# Patient Record
Sex: Female | Born: 1970 | Race: White | Hispanic: No | Marital: Married | Smoking: Never smoker
Health system: Southern US, Community
[De-identification: ages and names within clinical notes are randomized; demographics above are authoritative.]

## PROBLEM LIST (undated history)

## (undated) DIAGNOSIS — R51 Headache: Secondary | ICD-10-CM

## (undated) DIAGNOSIS — Z9289 Personal history of other medical treatment: Secondary | ICD-10-CM

## (undated) DIAGNOSIS — K8020 Calculus of gallbladder without cholecystitis without obstruction: Secondary | ICD-10-CM

## (undated) DIAGNOSIS — D649 Anemia, unspecified: Secondary | ICD-10-CM

## (undated) DIAGNOSIS — F329 Major depressive disorder, single episode, unspecified: Secondary | ICD-10-CM

## (undated) DIAGNOSIS — R569 Unspecified convulsions: Secondary | ICD-10-CM

## (undated) DIAGNOSIS — F32A Depression, unspecified: Secondary | ICD-10-CM

## (undated) DIAGNOSIS — M419 Scoliosis, unspecified: Secondary | ICD-10-CM

## (undated) DIAGNOSIS — I10 Essential (primary) hypertension: Secondary | ICD-10-CM

## (undated) HISTORY — PX: BREAST SURGERY: SHX581

## (undated) HISTORY — PX: WISDOM TOOTH EXTRACTION: SHX21

## (undated) HISTORY — PX: SPINE SURGERY: SHX786

---

## 1999-03-09 ENCOUNTER — Encounter: Payer: Self-pay | Admitting: Obstetrics and Gynecology

## 1999-03-09 ENCOUNTER — Ambulatory Visit (HOSPITAL_COMMUNITY): Admission: RE | Admit: 1999-03-09 | Discharge: 1999-03-09 | Payer: Self-pay | Admitting: Obstetrics and Gynecology

## 1999-07-01 ENCOUNTER — Inpatient Hospital Stay (HOSPITAL_COMMUNITY): Admission: AD | Admit: 1999-07-01 | Discharge: 1999-07-03 | Payer: Self-pay | Admitting: Obstetrics and Gynecology

## 2004-12-23 ENCOUNTER — Ambulatory Visit: Payer: Self-pay | Admitting: Family Medicine

## 2005-01-05 ENCOUNTER — Ambulatory Visit (HOSPITAL_BASED_OUTPATIENT_CLINIC_OR_DEPARTMENT_OTHER): Admission: RE | Admit: 2005-01-05 | Discharge: 2005-01-05 | Payer: Self-pay | Admitting: Urology

## 2005-01-05 ENCOUNTER — Ambulatory Visit (HOSPITAL_COMMUNITY): Admission: RE | Admit: 2005-01-05 | Discharge: 2005-01-05 | Payer: Self-pay | Admitting: Urology

## 2005-08-16 ENCOUNTER — Ambulatory Visit: Payer: Self-pay | Admitting: Family Medicine

## 2006-04-18 ENCOUNTER — Encounter: Admission: RE | Admit: 2006-04-18 | Discharge: 2006-04-18 | Payer: Self-pay | Admitting: General Surgery

## 2006-06-18 ENCOUNTER — Ambulatory Visit: Payer: Self-pay | Admitting: Family Medicine

## 2006-06-29 ENCOUNTER — Ambulatory Visit (HOSPITAL_COMMUNITY): Admission: RE | Admit: 2006-06-29 | Discharge: 2006-06-29 | Payer: Self-pay | Admitting: Family Medicine

## 2006-07-24 ENCOUNTER — Encounter: Admission: RE | Admit: 2006-07-24 | Discharge: 2006-07-24 | Payer: Self-pay | Admitting: Neurosurgery

## 2006-12-12 IMAGING — CT CT L SPINE W/ CM
1 of 11 series · 5 of 20 positions shown, 7 images · IV contrast (omnipaque)
Comparison: none

CLINICAL DATA: Right buttock pain.  Right lumbar radiculopathy.  
LUMBAR MYELOGRAM:
TECHNIQUE: Following informed consent, sterile preparation of the back, and adequate local anesthesia, a lumbar puncture was performed using a 22 gauge spinal needle at L4-5 from a left paramedian approach.  Fluid was clear and colorless.  15 cc of Omnipaque 180 was instilled in the subarachnoid space.
TECHNIQUE: Multidetector CT imaging of the lumbar spine was performed after intrathecal injection of contrast.  Multiplanar CT image reconstructions were also generated.

[Series 4: recon 3: l spine · axial · 0.27mm/px · z∈[-235,-109]mm · 5 of 316 slices shown, 7 images]
[im 53/316  soft-tissue]
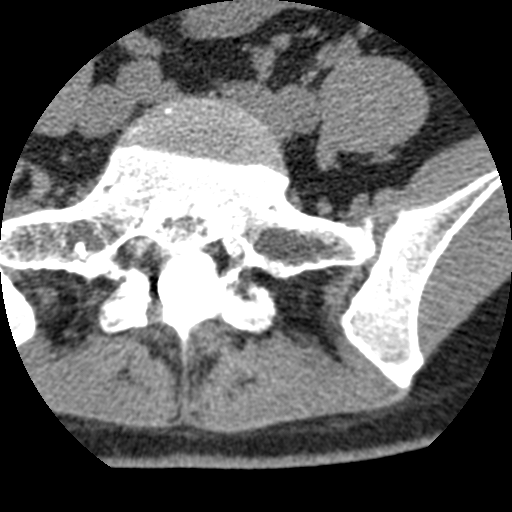
[im 53/316  bone]
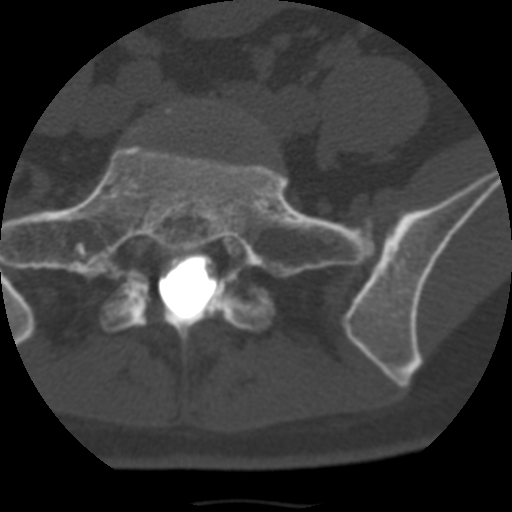
[im 106/316  bone]
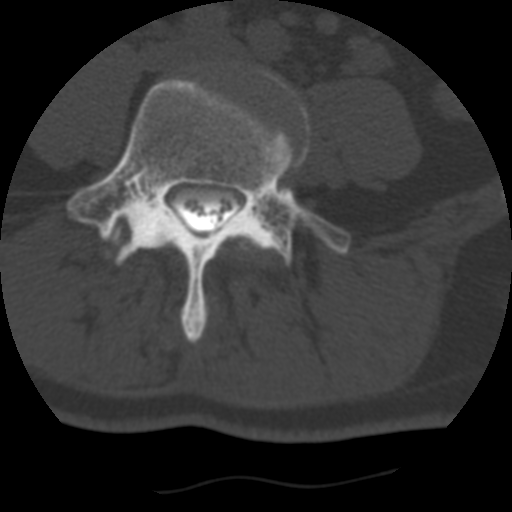
[im 158/316  bone]
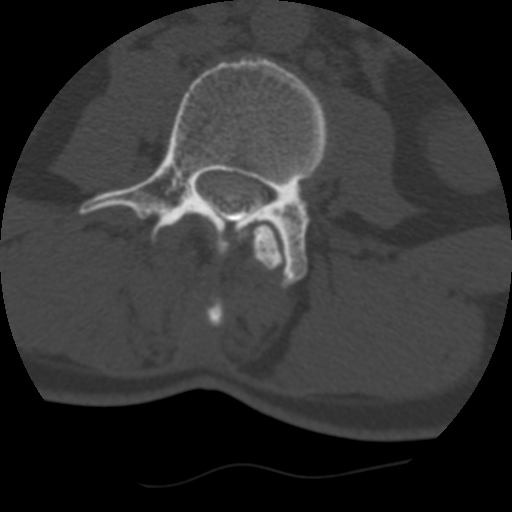
[im 211/316  bone]
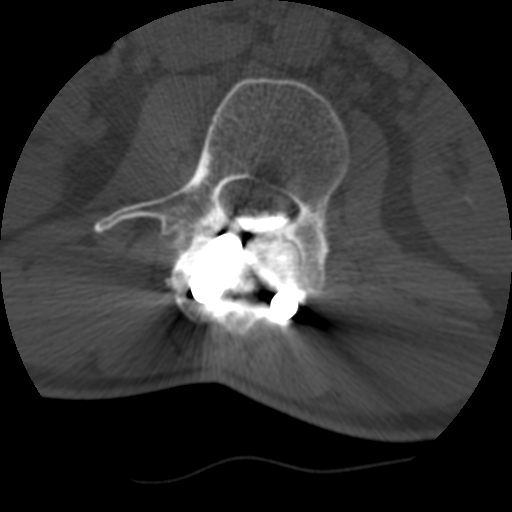
[im 263/316  soft-tissue]
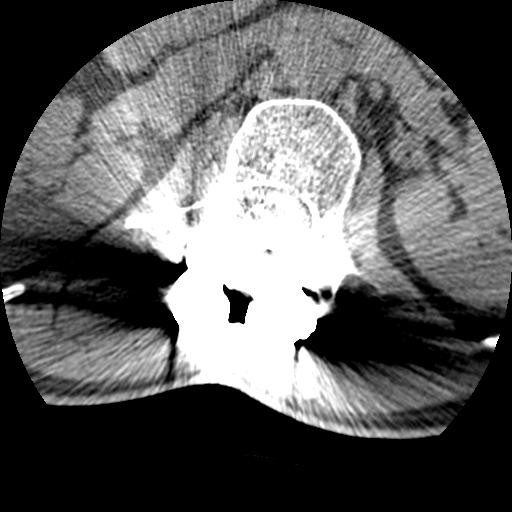
[im 263/316  bone]
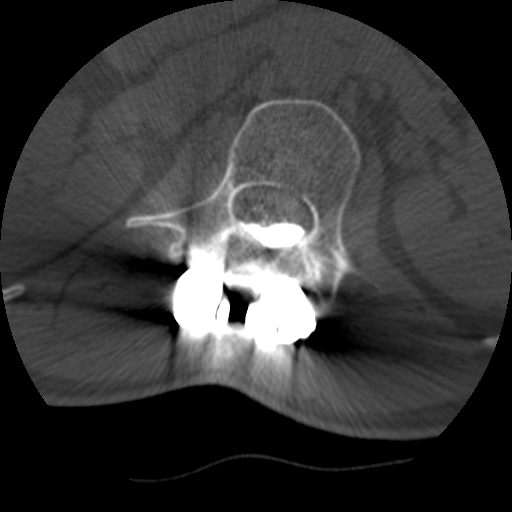

[5 of 20 positions shown; findings below may reference images not displayed]

FINDINGS: A mixed injection was obtained.  Overall the study, however, is diagnostic.  There has been previous thoracolumbar fusion with Harrington rods for scoliosis.  This occurred when the patient was a teenager.    There is no obvious disk protrusion, spinal stenosis or nerve root encroachment in the lumbar region.  Harrington rods are intact and the hooks are appropriately placed.  I see no right sided nerve root encroachment.  The patient does have bilateral renal calculi more numerous on the right.  
POST MYELOGRAM CT SCAN OF THE LUMBAR SPINE:
FINDINGS: L1-2:  Degraded by artifact.  No gross abnormality.  
L2-3:  Degraded by artifact.  No gross abnormality.
L3-4:  Normal interspace. 
L4-5:  Mild facet disease.  Otherwise normal interspace. 
L5-S1:  Normal interspace.
IMPRESSION: 1.  Harrington rod fusion for scoliosis as a teenager without adverse features. 
2.  No right sided neural encroachment identified in the lower lumbar spaces specifically L2-3, L3-4, L4-5 and L5-S1.

## 2013-01-08 ENCOUNTER — Other Ambulatory Visit: Payer: Self-pay | Admitting: Family Medicine

## 2013-01-08 DIAGNOSIS — N63 Unspecified lump in unspecified breast: Secondary | ICD-10-CM

## 2013-01-08 DIAGNOSIS — R1011 Right upper quadrant pain: Secondary | ICD-10-CM

## 2013-01-09 ENCOUNTER — Other Ambulatory Visit: Payer: Self-pay | Admitting: Family Medicine

## 2013-01-09 ENCOUNTER — Other Ambulatory Visit: Payer: Self-pay

## 2013-01-09 ENCOUNTER — Ambulatory Visit
Admission: RE | Admit: 2013-01-09 | Discharge: 2013-01-09 | Disposition: A | Discharge status: Home / Self Care | Payer: BC Managed Care – PPO | Source: Ambulatory Visit | Attending: Family Medicine | Admitting: Family Medicine

## 2013-01-09 DIAGNOSIS — R1011 Right upper quadrant pain: Secondary | ICD-10-CM

## 2013-01-20 ENCOUNTER — Ambulatory Visit (INDEPENDENT_AMBULATORY_CARE_PROVIDER_SITE_OTHER): Payer: BC Managed Care – PPO | Admitting: Surgery

## 2013-01-20 ENCOUNTER — Encounter (INDEPENDENT_AMBULATORY_CARE_PROVIDER_SITE_OTHER): Payer: Self-pay | Admitting: Surgery

## 2013-01-20 VITALS — BP 120/68 | HR 80 | Temp 98.4°F | Resp 16 | Ht 64.0 in | Wt 146.4 lb

## 2013-01-20 DIAGNOSIS — K8050 Calculus of bile duct without cholangitis or cholecystitis without obstruction: Secondary | ICD-10-CM

## 2013-01-20 DIAGNOSIS — K8020 Calculus of gallbladder without cholecystitis without obstruction: Secondary | ICD-10-CM

## 2013-01-20 NOTE — Progress Notes (Signed)
Patient ID: Mary Pope, female   DOB: 01/30/1971, 42 y.o.   MRN: [id number]  Chief Complaint  Patient presents with  . New Evaluation    eval GB with stones    HPI Mary Pope is a 42 y.o. female.  Patient sent at the request of Dr. Rosezetta Schlatter for epigastric abdominal pain. Pain is after eating. Pain is located in the epigastrium and right upper quadrant. Moderate intensity. Last minutes to hours after eating. One year history of these symptoms which have been intermittent. HPI  History reviewed. No pertinent past medical history.  Past Surgical History  Procedure Date  . Breast surgery     multiple Bx's  . Spine surgery     Family History  Problem Relation Age of Onset  . Cancer Mother     breast  . Cancer Maternal Grandfather     lung    Social History History  Substance Use Topics  . Smoking status: Former Games developer  . Smokeless tobacco: Never Used  . Alcohol Use: No    Not on File  Current Outpatient Prescriptions  Medication Sig Dispense Refill  . lisinopril (PRINIVIL,ZESTRIL) 5 MG tablet Take 5 mg by mouth daily. Pt unsure of actual dosage      . SUMAtriptan (IMITREX) 50 MG tablet Take 50 mg by mouth every 2 (two) hours as needed. Pt unsure of actual dosage        Review of Systems Review of Systems  Constitutional: Negative for fever, chills and unexpected weight change.  HENT: Negative for hearing loss, congestion, sore throat, trouble swallowing and voice change.   Eyes: Negative for visual disturbance.  Respiratory: Negative for cough and wheezing.   Cardiovascular: Negative for chest pain, palpitations and leg swelling.  Gastrointestinal: Positive for abdominal pain. Negative for nausea, vomiting, diarrhea, constipation, blood in stool, abdominal distention and anal bleeding.  Genitourinary: Negative for hematuria, vaginal bleeding and difficulty urinating.  Musculoskeletal: Negative for arthralgias.  Skin: Negative for rash and wound.   Neurological: Negative for seizures, syncope and headaches.  Hematological: Negative for adenopathy. Does not bruise/bleed easily.  Psychiatric/Behavioral: Negative for confusion.    Blood pressure 120/68, pulse 80, temperature 98.4 F (36.9 C), temperature source Temporal, resp. rate 16, height 5\' 4"  (1.626 m), weight 146 lb 6.4 oz (66.407 kg), last menstrual period 01/09/2013.  Physical Exam Physical Exam  Constitutional: She is oriented to person, place, and time. She appears well-developed and well-nourished.  HENT:  Head: Normocephalic and atraumatic.  Eyes: EOM are normal. Pupils are equal, round, and reactive to light.  Neck: Normal range of motion. Neck supple.  Cardiovascular: Normal rate and regular rhythm.   Pulmonary/Chest: Effort normal and breath sounds normal.  Abdominal: Soft. Bowel sounds are normal. She exhibits no distension. There is no tenderness. There is no rebound and no guarding.  Musculoskeletal: Normal range of motion.  Neurological: She is alert and oriented to person, place, and time.  Skin: Skin is warm and dry.  Psychiatric: She has a normal mood and affect. Her behavior is normal. Judgment and thought content normal.    Data Reviewed  US Abdomen Limited RUQ   Status: Final result       PACS Images     Show images for US Abdomen Limited RUQ      Study Result     *RADIOLOGY REPORT*   Clinical Data:  Right upper quadrant abdominal pain   LIMITED ABDOMINAL ULTRASOUND - RIGHT UPPER QUADRANT   Comparison:  [facility] of 01/05/2005   Findings:   Gallbladder:  There are multiple small gallstones present of no more than 8 mm in diameter with shadowing.  There is no pain over the gallbladder with compression.   Common bile duct:  The common bile duct is normal measuring 2.9 mm in diameter.   Liver:  The liver has a normal echogenic pattern.  No ductal dilatation is seen.  A cyst is noted in the medial left lobe of 1.5 x 1.2 x 1.9 cm.    IMPRESSION:   1.  Multiple small gallstones of 8 mm or less in size.  No evidence of acute cholecystitis by ultrasound. 2.  No ductal dilatation.                                    Assessment    Biliary colic    Plan    Recommend laparoscopic cholecystectomy with cholangiogram.The procedure has been discussed with the patient. Operative and non operative treatments have been discussed. Risks of surgery include bleeding, infection,  Common bile duct injury,  Injury to the stomach,liver, colon,small intestine, abdominal wall,  Diaphragm,  Major blood vessels,  And the need for an open procedure.  Other risks include worsening of medical problems, death,  DVT and pulmonary embolism, and cardiovascular events.   Medical options have also been discussed. The patient has been informed of long term expectations of surgery and non surgical options,  The patient agrees to proceed.         Kanitra Purifoy A. 01/20/2013, 10:08 AM

## 2013-01-20 NOTE — Patient Instructions (Signed)
Laparoscopic Cholecystectomy Care After Refer to this sheet in the next few weeks. These instructions provide you with information on caring for yourself after your procedure. Your caregiver may also give you more specific instructions. Your treatment has been planned according to current medical practices, but problems sometimes occur. Call your caregiver if you have any problems or questions after your procedure. HOME CARE INSTRUCTIONS   Change bandages (dressings) as directed by your caregiver.  Keep the wound dry and clean. The wound may be washed gently with soap and water. Gently blot or dab the area dry.  Do not take baths or use swimming pools or hot tubs for 10 days, or as instructed by your caregiver.  Only take over-the-counter or prescription medicines for pain, discomfort, or fever as directed by your caregiver.  Continue your normal diet as directed by your caregiver.  Do not lift anything heavier than 25 pounds (11.5 kg), or as directed by your caregiver.  Do not play contact sports for 1 week, or as directed by your caregiver. SEEK MEDICAL CARE IF:   There is redness, swelling, or increasing pain in the wound.  You notice yellowish-white fluid (pus) coming from the wound.  There is drainage from the wound that lasts longer than 1 day.  There is a bad smell coming from the wound or dressing.  The surgical cut (incision) breaks open. SEEK IMMEDIATE MEDICAL CARE IF:   You develop a rash.  You have difficulty breathing.  You develop chest pain.  You develop any reaction or side effects to medicines given.  You have a fever.  You have increasing pain in the shoulders (shoulder strap areas).  You have dizzy episodes or faint while standing.  You develop severe abdominal pain.  You feel sick to your stomach (nauseous) or throw up (vomit) and this lasts for more than 1 day. MAKE SURE YOU:   Understand these instructions.  Will watch your condition.  Will  get help right away if you are not doing well or get worse. Document Released: 12/04/2005 Document Revised: 02/26/2012 Document Reviewed: 05/19/2011 [facility] Patient Information 2013 [place], [place]. Laparoscopic Cholecystectomy Laparoscopic cholecystectomy is surgery to remove the gallbladder. The gallbladder is located slightly to the right of center in the abdomen, behind the liver. It is a concentrating and storage sac for the bile produced in the liver. Bile aids in the digestion and absorption of fats. Gallbladder disease (cholecystitis) is an inflammation of your gallbladder. This condition is usually caused by a buildup of gallstones (cholelithiasis) in your gallbladder. Gallstones can block the flow of bile, resulting in inflammation and pain. In severe cases, emergency surgery may be required. When emergency surgery is not required, you will have time to prepare for the procedure. Laparoscopic surgery is an alternative to open surgery. Laparoscopic surgery usually has a shorter recovery time. Your common bile duct may also need to be examined and explored. Your caregiver will discuss this with you if he or she feels this should be done. If stones are found in the common bile duct, they may be removed. LET YOUR CAREGIVER KNOW ABOUT:  Allergies to food or medicine.  Medicines taken, including vitamins, herbs, eyedrops, over-the-counter medicines, and creams.  Use of steroids (by mouth or creams).  Previous problems with anesthetics or numbing medicines.  History of bleeding problems or blood clots.  Previous surgery.  Other health problems, including diabetes and kidney problems.  Possibility of pregnancy, if this applies. RISKS AND COMPLICATIONS All surgery is associated  with risks. Some problems that may occur following this procedure include:  Infection.  Damage to the common bile duct, nerves, arteries, veins, or other internal organs such as the stomach or  intestines.  Bleeding.  A stone may remain in the common bile duct. BEFORE THE PROCEDURE  Do not take aspirin for 3 days prior to surgery or blood thinners for 1 week prior to surgery.  Do not eat or drink anything after midnight the night before surgery.  Let your caregiver know if you develop a cold or other infectious problem prior to surgery.  You should be present 60 minutes before the procedure or as directed. PROCEDURE  You will be given medicine that makes you sleep (general anesthetic). When you are asleep, your surgeon will make several small cuts (incisions) in your abdomen. One of these incisions is used to insert a small, lighted scope (laparoscope) into the abdomen. The laparoscope helps the surgeon see into your abdomen. Carbon dioxide gas will be pumped into your abdomen. The gas allows more room for the surgeon to perform your surgery. Other operating instruments are inserted through the other incisions. Laparoscopic procedures may not be appropriate when:  There is major scarring from previous surgery.  The gallbladder is extremely inflamed.  There are bleeding disorders or unexpected cirrhosis of the liver.  A pregnancy is near term.  Other conditions make the laparoscopic procedure impossible. If your surgeon feels it is not safe to continue with a laparoscopic procedure, he or she will perform an open abdominal procedure. In this case, the surgeon will make an incision to open the abdomen. This gives the surgeon a larger view and field to work within. This may allow the surgeon to perform procedures that sometimes cannot be performed with a laparoscope alone. Open surgery has a longer recovery time. AFTER THE PROCEDURE  You will be taken to the recovery area where a nurse will watch and check your progress.  You may be allowed to go home the same day.  Do not resume physical activities until directed by your caregiver.  You may resume a normal diet and  activities as directed. Document Released: 12/04/2005 Document Revised: 02/26/2012 Document Reviewed: 05/19/2011 [facility] Patient Information 2013 [place], [place].

## 2013-01-24 ENCOUNTER — Encounter (HOSPITAL_COMMUNITY): Payer: Self-pay | Admitting: Pharmacy Technician

## 2013-01-28 ENCOUNTER — Ambulatory Visit (HOSPITAL_COMMUNITY)
Admission: RE | Admit: 2013-01-28 | Discharge: 2013-01-28 | Disposition: A | Discharge status: Home / Self Care | Payer: BC Managed Care – PPO | Source: Ambulatory Visit | Attending: Surgery | Admitting: Surgery

## 2013-01-28 ENCOUNTER — Encounter (HOSPITAL_COMMUNITY)
Admission: RE | Admit: 2013-01-28 | Discharge: 2013-01-28 | Disposition: A | Discharge status: Home / Self Care | Payer: BC Managed Care – PPO | Source: Ambulatory Visit | Attending: Surgery | Admitting: Surgery

## 2013-01-28 ENCOUNTER — Encounter (HOSPITAL_COMMUNITY): Payer: Self-pay

## 2013-01-28 DIAGNOSIS — Z01812 Encounter for preprocedural laboratory examination: Secondary | ICD-10-CM | POA: Insufficient documentation

## 2013-01-28 DIAGNOSIS — M4180 Other forms of scoliosis, site unspecified: Secondary | ICD-10-CM | POA: Insufficient documentation

## 2013-01-28 DIAGNOSIS — K8020 Calculus of gallbladder without cholecystitis without obstruction: Secondary | ICD-10-CM | POA: Insufficient documentation

## 2013-01-28 DIAGNOSIS — Z01810 Encounter for preprocedural cardiovascular examination: Secondary | ICD-10-CM | POA: Insufficient documentation

## 2013-01-28 HISTORY — DX: Scoliosis, unspecified: M41.9

## 2013-01-28 HISTORY — DX: Calculus of gallbladder without cholecystitis without obstruction: K80.20

## 2013-01-28 HISTORY — DX: Essential (primary) hypertension: I10

## 2013-01-28 HISTORY — DX: Headache: R51

## 2013-01-28 HISTORY — DX: Unspecified convulsions: R56.9

## 2013-01-28 LAB — CBC WITH DIFFERENTIAL/PLATELET
Basophils Relative: 0 % (ref 0–1)
Eosinophils Absolute: 0.3 10*3/uL (ref 0.0–0.7)
Eosinophils Relative: 4 % (ref 0–5)
MCH: 30.9 pg (ref 26.0–34.0)
MCHC: 35.6 g/dL (ref 30.0–36.0)
Neutrophils Relative %: 55 % (ref 43–77)
Platelets: 316 10*3/uL (ref 150–400)
RDW: 12.3 % (ref 11.5–15.5)

## 2013-01-28 LAB — SURGICAL PCR SCREEN
MRSA, PCR: NEGATIVE
Staphylococcus aureus: POSITIVE — AB

## 2013-01-28 LAB — COMPREHENSIVE METABOLIC PANEL
ALT: 12 U/L (ref 0–35)
Albumin: 4 g/dL (ref 3.5–5.2)
Alkaline Phosphatase: 47 U/L (ref 39–117)
Calcium: 9.7 mg/dL (ref 8.4–10.5)
Potassium: 3.9 mEq/L (ref 3.5–5.1)
Sodium: 133 mEq/L — ABNORMAL LOW (ref 135–145)
Total Protein: 7.7 g/dL (ref 6.0–8.3)

## 2013-01-28 NOTE — Patient Instructions (Signed)
YOUR SURGERY IS SCHEDULED AT [facility]  ON:  Friday  2/14  REPORT TO [place] [facility] AT:  5:30 AM      PHONE # FOR SHORT STAY IS [phone]  DO NOT EAT OR DRINK ANYTHING AFTER MIDNIGHT THE NIGHT BEFORE YOUR SURGERY.  YOU MAY BRUSH YOUR TEETH, RINSE OUT YOUR MOUTH--BUT NO WATER, NO FOOD, NO CHEWING GUM, NO MINTS, NO CANDIES, NO CHEWING TOBACCO.  PLEASE TAKE THE FOLLOWING MEDICATIONS THE AM OF YOUR SURGERY WITH A FEW SIPS OF WATER: TAKE IMITREX IF MIGRAINE.    DO NOT BRING VALUABLES, MONEY, CREDIT CARDS.  DO NOT WEAR JEWELRY, MAKE-UP, NAIL POLISH AND NO METAL PINS OR CLIPS IN YOUR HAIR. CONTACT LENS, DENTURES / PARTIALS, GLASSES SHOULD NOT BE WORN TO SURGERY AND IN MOST CASES-HEARING AIDS WILL NEED TO BE REMOVED.  BRING YOUR GLASSES CASE, ANY EQUIPMENT NEEDED FOR YOUR CONTACT LENS. FOR PATIENTS ADMITTED TO THE HOSPITAL--CHECK OUT TIME THE DAY OF DISCHARGE IS 11:00 AM.  ALL INPATIENT ROOMS ARE PRIVATE - WITH BATHROOM, TELEPHONE, TELEVISION AND WIFI INTERNET.  IF YOU ARE BEING DISCHARGED THE SAME DAY OF YOUR SURGERY--YOU CAN NOT DRIVE YOURSELF HOME--AND SHOULD NOT GO HOME ALONE BY TAXI OR BUS.  NO DRIVING OR OPERATING MACHINERY FOR 24 HOURS FOLLOWING ANESTHESIA / PAIN MEDICATIONS.  PLEASE MAKE ARRANGEMENTS FOR SOMEONE TO BE WITH YOU AT HOME THE FIRST 24 HOURS AFTER SURGERY. RESPONSIBLE DRIVER'S NAME TONY Geibel                                               PHONE #   [phone]                            PLEASE READ OVER ANY  FACT SHEETS THAT YOU WERE GIVEN: MRSA INFORMATION, BLOOD TRANSFUSION INFORMATION, INCENTIVE SPIROMETER INFORMATION. FAILURE TO FOLLOW THESE INSTRUCTIONS MAY RESULT IN THE CANCELLATION OF YOUR SURGERY.   PATIENT SIGNATURE_________________________________

## 2013-01-28 NOTE — Pre-Procedure Instructions (Signed)
PREOP CBC,DIFF, CMET, SERUM PREG, EKG, CXR WERE DONE TODAY AT [facility] AS PER ORDERS DR. CORNETT AND ANESTHESIOLOGIST'S GUIDELINES.

## 2013-01-29 NOTE — Pre-Procedure Instructions (Signed)
PT'S PRESCRIPTION FOR MUPIROCIN CALLED IN TO [facility] AS PER REQUEST OF PT-SHE STATES HER HUSBAND WILL PICK UP RX AND SHE WILL START TX TODAY - POSITIVE [facility] AUREUS PCR.

## 2013-01-30 NOTE — Anesthesia Preprocedure Evaluation (Addendum)
Anesthesia Evaluation  Patient identified by MRN, date of birth, ID band Patient awake    Reviewed: Allergy & Precautions, H&P , NPO status , Patient's Chart, lab work & pertinent test results  Airway Mallampati: II TM Distance: >3 FB Neck ROM: full    Dental no notable dental hx. (+) Teeth Intact and Dental Advisory Given   Pulmonary neg pulmonary ROS,  breath sounds clear to auscultation  Pulmonary exam normal       Cardiovascular Exercise Tolerance: Good hypertension, Pt. on medications Rhythm:regular Rate:Normal     Neuro/Psych Seizures -, Well Controlled,  Remote seizures negative neurological ROS  negative psych ROS   GI/Hepatic negative GI ROS, Neg liver ROS,   Endo/Other  negative endocrine ROS  Renal/GU negative Renal ROS  negative genitourinary   Musculoskeletal   Abdominal   Peds  Hematology negative hematology ROS (+)   Anesthesia Other Findings   Reproductive/Obstetrics negative OB ROS                         Anesthesia Physical Anesthesia Plan  ASA: II  Anesthesia Plan: General   Post-op Pain Management:    Induction: Intravenous  Airway Management Planned: Oral ETT  Additional Equipment:   Intra-op Plan:   Post-operative Plan: Extubation in OR  Informed Consent: I have reviewed the patients History and Physical, chart, labs and discussed the procedure including the risks, benefits and alternatives for the proposed anesthesia with the patient or authorized representative who has indicated his/her understanding and acceptance.   Dental Advisory Given  Plan Discussed with: CRNA and Surgeon  Anesthesia Plan Comments:         Anesthesia Quick Evaluation

## 2013-01-31 ENCOUNTER — Encounter (HOSPITAL_COMMUNITY): Payer: Self-pay | Admitting: Anesthesiology

## 2013-01-31 ENCOUNTER — Ambulatory Visit (HOSPITAL_COMMUNITY)
Admission: RE | Admit: 2013-01-31 | Discharge: 2013-01-31 | Disposition: A | Discharge status: Home / Self Care | Payer: BC Managed Care – PPO | Source: Ambulatory Visit | Attending: Surgery | Admitting: Surgery

## 2013-01-31 ENCOUNTER — Ambulatory Visit (HOSPITAL_COMMUNITY): Payer: BC Managed Care – PPO | Admitting: Anesthesiology

## 2013-01-31 ENCOUNTER — Ambulatory Visit (HOSPITAL_COMMUNITY): Payer: BC Managed Care – PPO

## 2013-01-31 ENCOUNTER — Encounter (HOSPITAL_COMMUNITY)
Admission: RE | Disposition: A | Discharge status: Home / Self Care | Payer: Self-pay | Source: Ambulatory Visit | Attending: Surgery

## 2013-01-31 ENCOUNTER — Encounter (HOSPITAL_COMMUNITY): Payer: Self-pay | Admitting: *Deleted

## 2013-01-31 DIAGNOSIS — Z9889 Other specified postprocedural states: Secondary | ICD-10-CM

## 2013-01-31 DIAGNOSIS — K8010 Calculus of gallbladder with chronic cholecystitis without obstruction: Secondary | ICD-10-CM

## 2013-01-31 DIAGNOSIS — Z79899 Other long term (current) drug therapy: Secondary | ICD-10-CM | POA: Insufficient documentation

## 2013-01-31 DIAGNOSIS — I10 Essential (primary) hypertension: Secondary | ICD-10-CM | POA: Insufficient documentation

## 2013-01-31 HISTORY — PX: CHOLECYSTECTOMY: SHX55

## 2013-01-31 SURGERY — LAPAROSCOPIC CHOLECYSTECTOMY WITH INTRAOPERATIVE CHOLANGIOGRAM
Anesthesia: General | Site: Abdomen | Wound class: Clean Contaminated

## 2013-01-31 MED ORDER — PROMETHAZINE HCL 12.5 MG PO TABS
12.5000 mg | ORAL_TABLET | Freq: Four times a day (QID) | ORAL | Status: DC | PRN
Start: 2013-01-31 — End: 2013-02-18

## 2013-01-31 MED ORDER — CEFAZOLIN SODIUM-DEXTROSE 2-3 GM-% IV SOLR
2.0000 g | INTRAVENOUS | Status: AC
  Administered 2013-01-31: 2 g via INTRAVENOUS

## 2013-01-31 MED ORDER — ACETAMINOPHEN 325 MG PO TABS: 650.0000 mg | ORAL_TABLET | ORAL | Status: DC | PRN

## 2013-01-31 MED ORDER — ACETAMINOPHEN 10 MG/ML IV SOLN
INTRAVENOUS | Status: AC
  Filled 2013-01-31: qty 100

## 2013-01-31 MED ORDER — CHLORHEXIDINE GLUCONATE 4 % EX LIQD
1.0000 "application " | Freq: Once | CUTANEOUS | Status: DC
  Filled 2013-01-31: qty 15

## 2013-01-31 MED ORDER — LACTATED RINGERS IV SOLN: INTRAVENOUS | Status: DC

## 2013-01-31 MED ORDER — SODIUM CHLORIDE 0.9 % IJ SOLN: 3.0000 mL | INTRAMUSCULAR | Status: DC | PRN

## 2013-01-31 MED ORDER — ROCURONIUM BROMIDE 100 MG/10ML IV SOLN
INTRAVENOUS | Status: DC | PRN
  Administered 2013-01-31: 30 mg via INTRAVENOUS

## 2013-01-31 MED ORDER — SODIUM CHLORIDE 0.9 % IJ SOLN: 3.0000 mL | Freq: Two times a day (BID) | INTRAMUSCULAR | Status: DC

## 2013-01-31 MED ORDER — GLYCOPYRROLATE 0.2 MG/ML IJ SOLN
INTRAMUSCULAR | Status: DC | PRN
  Administered 2013-01-31: .5 mg via INTRAVENOUS

## 2013-01-31 MED ORDER — NEOSTIGMINE METHYLSULFATE 1 MG/ML IJ SOLN
INTRAMUSCULAR | Status: DC | PRN
  Administered 2013-01-31: 3 mg via INTRAVENOUS

## 2013-01-31 MED ORDER — FENTANYL CITRATE 0.05 MG/ML IJ SOLN
INTRAMUSCULAR | Status: AC
  Filled 2013-01-31: qty 2

## 2013-01-31 MED ORDER — IOHEXOL 300 MG/ML  SOLN
INTRAMUSCULAR | Status: DC | PRN
  Administered 2013-01-31: 4 mL

## 2013-01-31 MED ORDER — PROPOFOL 10 MG/ML IV BOLUS
INTRAVENOUS | Status: DC | PRN
  Administered 2013-01-31: 150 mg via INTRAVENOUS

## 2013-01-31 MED ORDER — OXYCODONE HCL 5 MG PO TABS
5.0000 mg | ORAL_TABLET | ORAL | Status: DC | PRN
  Administered 2013-01-31 (×2): 5 mg via ORAL
  Filled 2013-01-31 (×2): qty 1

## 2013-01-31 MED ORDER — MORPHINE SULFATE 10 MG/ML IJ SOLN: 1.0000 mg | INTRAMUSCULAR | Status: DC | PRN

## 2013-01-31 MED ORDER — SODIUM CHLORIDE 0.9 % IV SOLN: 250.0000 mL | INTRAVENOUS | Status: DC | PRN

## 2013-01-31 MED ORDER — FENTANYL CITRATE 0.05 MG/ML IJ SOLN
INTRAMUSCULAR | Status: AC
  Administered 2013-01-31: 25 ug via INTRAVENOUS
  Filled 2013-01-31: qty 2

## 2013-01-31 MED ORDER — LIDOCAINE HCL (PF) 2 % IJ SOLN
INTRAMUSCULAR | Status: DC | PRN
  Administered 2013-01-31: 20 mg

## 2013-01-31 MED ORDER — CEFAZOLIN SODIUM-DEXTROSE 2-3 GM-% IV SOLR
INTRAVENOUS | Status: AC
  Filled 2013-01-31: qty 50

## 2013-01-31 MED ORDER — ACETAMINOPHEN 10 MG/ML IV SOLN
INTRAVENOUS | Status: DC | PRN
  Administered 2013-01-31: 1000 mg via INTRAVENOUS

## 2013-01-31 MED ORDER — BUPIVACAINE-EPINEPHRINE PF 0.25-1:200000 % IJ SOLN
INTRAMUSCULAR | Status: AC
  Filled 2013-01-31: qty 30

## 2013-01-31 MED ORDER — KETOROLAC TROMETHAMINE 30 MG/ML IJ SOLN
INTRAMUSCULAR | Status: DC | PRN
  Administered 2013-01-31: 30 mg via INTRAVENOUS

## 2013-01-31 MED ORDER — ACETAMINOPHEN 650 MG RE SUPP
650.0000 mg | RECTAL | Status: DC | PRN
  Filled 2013-01-31: qty 1

## 2013-01-31 MED ORDER — ONDANSETRON HCL 4 MG/2ML IJ SOLN
INTRAMUSCULAR | Status: DC | PRN
  Administered 2013-01-31: 4 mg via INTRAVENOUS

## 2013-01-31 MED ORDER — ONDANSETRON HCL 4 MG/2ML IJ SOLN: 4.0000 mg | Freq: Four times a day (QID) | INTRAMUSCULAR | Status: DC | PRN

## 2013-01-31 MED ORDER — FENTANYL CITRATE 0.05 MG/ML IJ SOLN
INTRAMUSCULAR | Status: DC | PRN
  Administered 2013-01-31: 100 ug via INTRAVENOUS
  Administered 2013-01-31: 50 ug via INTRAVENOUS

## 2013-01-31 MED ORDER — MIDAZOLAM HCL 5 MG/5ML IJ SOLN
INTRAMUSCULAR | Status: DC | PRN
  Administered 2013-01-31: 2 mg via INTRAVENOUS

## 2013-01-31 MED ORDER — SUCCINYLCHOLINE CHLORIDE 20 MG/ML IJ SOLN
INTRAMUSCULAR | Status: DC | PRN
  Administered 2013-01-31: 100 mg via INTRAVENOUS

## 2013-01-31 MED ORDER — LACTATED RINGERS IV SOLN
INTRAVENOUS | Status: DC | PRN
  Administered 2013-01-31: 07:00:00 via INTRAVENOUS

## 2013-01-31 MED ORDER — LACTATED RINGERS IV SOLN
INTRAVENOUS | Status: DC
  Administered 2013-01-31: 1000 mL via INTRAVENOUS

## 2013-01-31 MED ORDER — IOHEXOL 300 MG/ML  SOLN
INTRAMUSCULAR | Status: AC
  Filled 2013-01-31: qty 1

## 2013-01-31 MED ORDER — FENTANYL CITRATE 0.05 MG/ML IJ SOLN
25.0000 ug | INTRAMUSCULAR | Status: DC | PRN
  Administered 2013-01-31 (×2): 25 ug via INTRAVENOUS
  Administered 2013-01-31: 50 ug via INTRAVENOUS
  Administered 2013-01-31 (×2): 25 ug via INTRAVENOUS

## 2013-01-31 MED ORDER — OXYCODONE-ACETAMINOPHEN 5-325 MG PO TABS: 1.0000 | ORAL_TABLET | ORAL | Status: DC | PRN

## 2013-01-31 MED ORDER — BUPIVACAINE-EPINEPHRINE 0.25% -1:200000 IJ SOLN
INTRAMUSCULAR | Status: DC | PRN
  Administered 2013-01-31: 13 mL

## 2013-01-31 MED ORDER — LACTATED RINGERS IV SOLN
INTRAVENOUS | Status: DC | PRN
  Administered 2013-01-31: 1000 mL

## 2013-01-31 SURGICAL SUPPLY — 38 items
ADH SKN CLS APL DERMABOND .7 (GAUZE/BANDAGES/DRESSINGS) ×2
APPLIER CLIP ROT 10 11.4 M/L (STAPLE) ×2
APR CLP MED LRG 11.4X10 (STAPLE) ×1
BAG SPEC RTRVL LRG 6X4 10 (ENDOMECHANICALS) ×1
CANISTER SUCTION 2500CC (MISCELLANEOUS) ×2 IMPLANT
CHLORAPREP W/TINT 26ML (MISCELLANEOUS) ×2 IMPLANT
CLIP APPLIE ROT 10 11.4 M/L (STAPLE) ×1 IMPLANT
CLOTH BEACON ORANGE TIMEOUT ST (SAFETY) ×2 IMPLANT
COVER MAYO STAND STRL (DRAPES) ×2 IMPLANT
DECANTER SPIKE VIAL GLASS SM (MISCELLANEOUS) ×2 IMPLANT
DERMABOND ADVANCED (GAUZE/BANDAGES/DRESSINGS) ×2
DERMABOND ADVANCED .7 DNX12 (GAUZE/BANDAGES/DRESSINGS) IMPLANT
DRAPE C-ARM 42X72 X-RAY (DRAPES) ×2 IMPLANT
DRAPE LAPAROSCOPIC ABDOMINAL (DRAPES) ×2 IMPLANT
DRAPE WARM FLUID 44X44 (DRAPE) ×2 IMPLANT
ELECT REM PT RETURN 9FT ADLT (ELECTROSURGICAL) ×2
ELECTRODE REM PT RTRN 9FT ADLT (ELECTROSURGICAL) ×1 IMPLANT
FILTER SMOKE EVAC LAPAROSHD (FILTER) ×2 IMPLANT
GLOVE BIOGEL PI IND STRL 7.0 (GLOVE) ×1 IMPLANT
GLOVE BIOGEL PI INDICATOR 7.0 (GLOVE) ×1
GLOVE INDICATOR 8.0 STRL GRN (GLOVE) ×4 IMPLANT
GLOVE SS BIOGEL STRL SZ 8 (GLOVE) ×1 IMPLANT
GLOVE SUPERSENSE BIOGEL SZ 8 (GLOVE) ×1
GOWN STRL NON-REIN LRG LVL3 (GOWN DISPOSABLE) ×2 IMPLANT
GOWN STRL REIN XL XLG (GOWN DISPOSABLE) ×4 IMPLANT
HEMOSTAT SURGICEL 4X8 (HEMOSTASIS) IMPLANT
KIT BASIN OR (CUSTOM PROCEDURE TRAY) ×2 IMPLANT
POUCH SPECIMEN RETRIEVAL 10MM (ENDOMECHANICALS) ×1 IMPLANT
SCISSORS LAP 5X35 DISP (ENDOMECHANICALS) ×1 IMPLANT
SET CHOLANGIOGRAPH MIX (MISCELLANEOUS) ×2 IMPLANT
SET IRRIG TUBING LAPAROSCOPIC (IRRIGATION / IRRIGATOR) ×2 IMPLANT
SUT MNCRL AB 4-0 PS2 18 (SUTURE) ×2 IMPLANT
TOWEL OR 17X26 10 PK STRL BLUE (TOWEL DISPOSABLE) ×2 IMPLANT
TRAY LAP CHOLE (CUSTOM PROCEDURE TRAY) ×2 IMPLANT
TROCAR BLADELESS OPT 5 75 (ENDOMECHANICALS) ×4 IMPLANT
TROCAR XCEL BLUNT TIP 100MML (ENDOMECHANICALS) ×2 IMPLANT
TROCAR XCEL NON-BLD 11X100MML (ENDOMECHANICALS) ×2 IMPLANT
TUBING INSUFFLATION 10FT LAP (TUBING) ×2 IMPLANT

## 2013-01-31 NOTE — Op Note (Signed)
Laparoscopic Cholecystectomy with IOC Procedure Note  Indications: This patient presents with symptomatic gallbladder disease and will undergo laparoscopic cholecystectomy.The procedure has been discussed with the patient. Operative and non operative treatments have been discussed. Risks of surgery include bleeding, infection,  Common bile duct injury,  Injury to the stomach,liver, colon,small intestine, abdominal wall,  Diaphragm,  Major blood vessels,  And the need for an open procedure.  Other risks include worsening of medical problems, death,  DVT and pulmonary embolism, and cardiovascular events.   Medical options have also been discussed. The patient has been informed of long term expectations of surgery and non surgical options,  The patient agrees to proceed.    Pre-operative Diagnosis: Biliary colic  Post-operative Diagnosis: same  Surgeon: Harriette Bouillon A.   Assistants: OR staff  Anesthesia: General endotracheal anesthesia and Local anesthesia 0.25.% bupivacaine, with epinephrine  ASA Class: 2  Procedure Details  The patient was seen again in the Holding Room. The risks, benefits, complications, treatment options, and expected outcomes were discussed with the patient. The possibilities of reaction to medication, pulmonary aspiration, perforation of viscus, bleeding, recurrent infection, finding a normal gallbladder, the need for additional procedures, failure to diagnose a condition, the possible need to convert to an open procedure, and creating a complication requiring transfusion or operation were discussed with the patient. The patient and/or family concurred with the proposed plan, giving informed consent. The site of surgery properly noted/marked. The patient was taken to Operating Room, identified as Mary Pope and the procedure verified as Laparoscopic Cholecystectomy with Intraoperative Cholangiograms. A Time Out was held and the above information confirmed.  Prior to the  induction of general anesthesia, antibiotic prophylaxis was administered. General endotracheal anesthesia was then administered and tolerated well. After the induction, the abdomen was prepped in the usual sterile fashion. The patient was positioned in the supine position with the left arm comfortably tucked, along with some reverse Trendelenburg.  Local anesthetic agent was injected into the skin near the umbilicus and an incision made. The midline fascia was incised and the Hasson technique was used to introduce a 12 mm port under direct vision. It was secured with a figure of eight Vicryl suture placed in the usual fashion. Pneumoperitoneum was then created with CO2 and tolerated well without any adverse changes in the patient's vital signs. Additional trocars were introduced under direct vision with an 11 mm trocar in the epigastrium and 2 5 mm trocars in the right upper quadrant. All skin incisions were infiltrated with a local anesthetic agent before making the incision and placing the trocars.   The gallbladder was identified, the fundus grasped and retracted cephalad. Adhesions were lysed bluntly and with the electrocautery where indicated, taking care not to injure any adjacent organs or viscus. The infundibulum was grasped and retracted laterally, exposing the peritoneum overlying the triangle of Calot. This was then divided and exposed in a blunt fashion. The cystic duct was clearly identified and bluntly dissected circumferentially. The junctions of the gallbladder, cystic duct and common bile duct were clearly identified prior to the division of any linear structure.   An incision was made in the cystic duct and the cholangiogram catheter introduced. The catheter was secured using an endoclip. The study showed no stones and good visualization of the distal and proximal biliary tree. The catheter was then removed.   The cystic duct was then  ligated with surgical clips  on the patient side and   clipped on the gallbladder side and  divided. The cystic artery was identified, dissected free, ligated with clips and divided as well. Posterior cystic artery clipped and divided.  The gallbladder was dissected from the liver bed in retrograde fashion with the electrocautery. The gallbladder was removed. The liver bed was irrigated and inspected. Hemostasis was achieved with the electrocautery. Copious irrigation was utilized and was repeatedly aspirated until clear all particulate matter. Hemostasis was achieved with cautery and  no signs  Of bleeding or bile leakage. Gallblader removed with endo catch bag.  Pneumoperitoneum was completely reduced after viewing removal of the trocars under direct vision. The wound was thoroughly irrigated and the fascia was then closed with a figure of eight suture; the skin was then closed with 4 O monocryl  and dermabond  dressing was applied.  Instrument, sponge, and needle counts were correct at closure and at the conclusion of the case.   Findings:  Cholelithiasis  Estimated Blood Loss: less than 50 mL                 Total IV Fluids: 1200 mL         Specimens: Gallbladder           Complications: None; patient tolerated the procedure well.         Disposition: PACU - hemodynamically stable.         Condition: stable

## 2013-01-31 NOTE — Interval H&P Note (Signed)
History and Physical Interval Note:  01/31/2013 7:13 AM  Mary Pope  has presented today for surgery, with the diagnosis of biliary colic  The various methods of treatment have been discussed with the patient and family. After consideration of risks, benefits and other options for treatment, the patient has consented to  Procedure(s): LAPAROSCOPIC CHOLECYSTECTOMY WITH INTRAOPERATIVE CHOLANGIOGRAM (N/A) as a surgical intervention .  The patient's history has been reviewed, patient examined, no change in status, stable for surgery.  I have reviewed the patient's chart and labs.  Questions were answered to the patient's satisfaction.     [name]

## 2013-01-31 NOTE — H&P (View-Only) (Signed)
Patient ID: Mary Pope, female   DOB: 09/07/1971, 42 y.o.   MRN: [id number]  Chief Complaint  Patient presents with  . New Evaluation    eval GB with stones    HPI Mary Pope is a 42 y.o. female.  Patient sent at the request of Dr. Burnette for epigastric abdominal pain. Pain is after eating. Pain is located in the epigastrium and right upper quadrant. Moderate intensity. Last minutes to hours after eating. One year history of these symptoms which have been intermittent. HPI  History reviewed. No pertinent past medical history.  Past Surgical History  Procedure Date  . Breast surgery     multiple Bx's  . Spine surgery     Family History  Problem Relation Age of Onset  . Cancer Mother     breast  . Cancer Maternal Grandfather     lung    Social History History  Substance Use Topics  . Smoking status: Former Smoker  . Smokeless tobacco: Never Used  . Alcohol Use: No    Not on File  Current Outpatient Prescriptions  Medication Sig Dispense Refill  . lisinopril (PRINIVIL,ZESTRIL) 5 MG tablet Take 5 mg by mouth daily. Pt unsure of actual dosage      . SUMAtriptan (IMITREX) 50 MG tablet Take 50 mg by mouth every 2 (two) hours as needed. Pt unsure of actual dosage        Review of Systems Review of Systems  Constitutional: Negative for fever, chills and unexpected weight change.  HENT: Negative for hearing loss, congestion, sore throat, trouble swallowing and voice change.   Eyes: Negative for visual disturbance.  Respiratory: Negative for cough and wheezing.   Cardiovascular: Negative for chest pain, palpitations and leg swelling.  Gastrointestinal: Positive for abdominal pain. Negative for nausea, vomiting, diarrhea, constipation, blood in stool, abdominal distention and anal bleeding.  Genitourinary: Negative for hematuria, vaginal bleeding and difficulty urinating.  Musculoskeletal: Negative for arthralgias.  Skin: Negative for rash and wound.   Neurological: Negative for seizures, syncope and headaches.  Hematological: Negative for adenopathy. Does not bruise/bleed easily.  Psychiatric/Behavioral: Negative for confusion.    Blood pressure 120/68, pulse 80, temperature 98.4 F (36.9 C), temperature source Temporal, resp. rate 16, height 5' 4" (1.626 m), weight 146 lb 6.4 oz (66.407 kg), last menstrual period 01/09/2013.  Physical Exam Physical Exam  Constitutional: She is oriented to person, place, and time. She appears well-developed and well-nourished.  HENT:  Head: Normocephalic and atraumatic.  Eyes: EOM are normal. Pupils are equal, round, and reactive to light.  Neck: Normal range of motion. Neck supple.  Cardiovascular: Normal rate and regular rhythm.   Pulmonary/Chest: Effort normal and breath sounds normal.  Abdominal: Soft. Bowel sounds are normal. She exhibits no distension. There is no tenderness. There is no rebound and no guarding.  Musculoskeletal: Normal range of motion.  Neurological: She is alert and oriented to person, place, and time.  Skin: Skin is warm and dry.  Psychiatric: She has a normal mood and affect. Her behavior is normal. Judgment and thought content normal.    Data Reviewed  US Abdomen Limited RUQ   Status: Final result       PACS Images     Show images for US Abdomen Limited RUQ      Study Result     *RADIOLOGY REPORT*   Clinical Data:  Right upper quadrant abdominal pain   LIMITED ABDOMINAL ULTRASOUND - RIGHT UPPER QUADRANT   Comparison:    [facility] of 01/05/2005   Findings:   Gallbladder:  There are multiple small gallstones present of no more than 8 mm in diameter with shadowing.  There is no pain over the gallbladder with compression.   Common bile duct:  The common bile duct is normal measuring 2.9 mm in diameter.   Liver:  The liver has a normal echogenic pattern.  No ductal dilatation is seen.  A cyst is noted in the medial left lobe of 1.5 x 1.2 x 1.9 cm.    IMPRESSION:   1.  Multiple small gallstones of 8 mm or less in size.  No evidence of acute cholecystitis by ultrasound. 2.  No ductal dilatation.                                    Assessment    Biliary colic    Plan    Recommend laparoscopic cholecystectomy with cholangiogram.The procedure has been discussed with the patient. Operative and non operative treatments have been discussed. Risks of surgery include bleeding, infection,  Common bile duct injury,  Injury to the stomach,liver, colon,small intestine, abdominal wall,  Diaphragm,  Major blood vessels,  And the need for an open procedure.  Other risks include worsening of medical problems, death,  DVT and pulmonary embolism, and cardiovascular events.   Medical options have also been discussed. The patient has been informed of long term expectations of surgery and non surgical options,  The patient agrees to proceed.         Mary Pope A. 01/20/2013, 10:08 AM    

## 2013-01-31 NOTE — Transfer of Care (Signed)
Immediate Anesthesia Transfer of Care Note  Patient: Mary Pope  Procedure(s) Performed: Procedure(s) (LRB): LAPAROSCOPIC CHOLECYSTECTOMY WITH INTRAOPERATIVE CHOLANGIOGRAM (N/A)  Patient Location: PACU  Anesthesia Type: General  Level of Consciousness: sedated, patient cooperative and responds to stimulaton  Airway & Oxygen Therapy: Patient Spontanous Breathing and Patient connected to face mask oxgen  Post-op Assessment: Report given to PACU RN and Post -op Vital signs reviewed and stable  Post vital signs: Reviewed and stable  Complications: No apparent anesthesia complications

## 2013-01-31 NOTE — Anesthesia Postprocedure Evaluation (Signed)
  Anesthesia Post-op Note  Patient: Mary Pope  Procedure(s) Performed: Procedure(s) (LRB): LAPAROSCOPIC CHOLECYSTECTOMY WITH INTRAOPERATIVE CHOLANGIOGRAM (N/A)  Patient Location: PACU  Anesthesia Type: General  Level of Consciousness: awake and alert   Airway and Oxygen Therapy: Patient Spontanous Breathing  Post-op Pain: mild  Post-op Assessment: Post-op Vital signs reviewed, Patient's Cardiovascular Status Stable, Respiratory Function Stable, Patent Airway and No signs of Nausea or vomiting  Last Vitals:  Filed Vitals:   01/31/13 0900  BP:   Pulse: 64  Temp:   Resp: 16    Post-op Vital Signs: stable   Complications: No apparent anesthesia complications

## 2013-02-03 ENCOUNTER — Encounter (HOSPITAL_COMMUNITY): Payer: Self-pay | Admitting: Surgery

## 2013-02-03 ENCOUNTER — Encounter (INDEPENDENT_AMBULATORY_CARE_PROVIDER_SITE_OTHER): Payer: Self-pay

## 2013-02-04 ENCOUNTER — Telehealth (INDEPENDENT_AMBULATORY_CARE_PROVIDER_SITE_OTHER): Payer: Self-pay | Admitting: General Surgery

## 2013-02-04 NOTE — Telephone Encounter (Signed)
Spoke with patient she has appt  02/18/13 4:40 for po f/u

## 2013-02-14 ENCOUNTER — Ambulatory Visit
Admission: RE | Admit: 2013-02-14 | Discharge: 2013-02-14 | Disposition: A | Discharge status: Home / Self Care | Payer: BC Managed Care – PPO | Source: Ambulatory Visit | Attending: Family Medicine | Admitting: Family Medicine

## 2013-02-14 DIAGNOSIS — N63 Unspecified lump in unspecified breast: Secondary | ICD-10-CM

## 2013-02-18 ENCOUNTER — Encounter (INDEPENDENT_AMBULATORY_CARE_PROVIDER_SITE_OTHER): Payer: Self-pay | Admitting: Surgery

## 2013-02-18 ENCOUNTER — Ambulatory Visit (INDEPENDENT_AMBULATORY_CARE_PROVIDER_SITE_OTHER): Payer: BC Managed Care – PPO | Admitting: Surgery

## 2013-02-18 VITALS — BP 110/84 | HR 100 | Temp 98.6°F | Resp 18 | Ht 64.0 in | Wt 145.4 lb

## 2013-02-18 DIAGNOSIS — Z9889 Other specified postprocedural states: Secondary | ICD-10-CM

## 2013-02-18 NOTE — Patient Instructions (Signed)
Return as needed

## 2013-02-18 NOTE — Progress Notes (Signed)
she is here for a postop visit following laparoscopic cholecystectomy.  Diet is being tolerated, bowels are moving.  No problems with incisions.  PE:  ABD:  Soft, incisions clean/dry/intact and solid.  Assessment:  Doing well postop.  Plan:  Lowfat diet recommended.  Activities as tolerated.  Return visit prn. 

## 2015-07-08 ENCOUNTER — Other Ambulatory Visit: Payer: Self-pay | Admitting: Obstetrics & Gynecology

## 2015-07-08 DIAGNOSIS — N63 Unspecified lump in unspecified breast: Secondary | ICD-10-CM

## 2015-07-14 ENCOUNTER — Other Ambulatory Visit: Payer: Self-pay

## 2015-08-09 ENCOUNTER — Other Ambulatory Visit: Payer: Self-pay | Admitting: Obstetrics & Gynecology

## 2015-08-09 ENCOUNTER — Ambulatory Visit
Admission: RE | Admit: 2015-08-09 | Discharge: 2015-08-09 | Disposition: A | Discharge status: Home / Self Care | Payer: BLUE CROSS/BLUE SHIELD | Source: Ambulatory Visit | Attending: Obstetrics & Gynecology | Admitting: Obstetrics & Gynecology

## 2015-08-09 DIAGNOSIS — N63 Unspecified lump in unspecified breast: Secondary | ICD-10-CM

## 2015-10-05 ENCOUNTER — Other Ambulatory Visit: Payer: Self-pay | Admitting: Gastroenterology

## 2015-10-05 DIAGNOSIS — R1013 Epigastric pain: Secondary | ICD-10-CM

## 2015-10-05 DIAGNOSIS — R1011 Right upper quadrant pain: Secondary | ICD-10-CM

## 2015-10-14 ENCOUNTER — Other Ambulatory Visit: Payer: BLUE CROSS/BLUE SHIELD

## 2015-11-05 ENCOUNTER — Ambulatory Visit
Admission: RE | Admit: 2015-11-05 | Discharge: 2015-11-05 | Disposition: A | Discharge status: Home / Self Care | Payer: BLUE CROSS/BLUE SHIELD | Source: Ambulatory Visit | Attending: Gastroenterology | Admitting: Gastroenterology

## 2015-11-05 DIAGNOSIS — R1013 Epigastric pain: Secondary | ICD-10-CM

## 2015-11-05 DIAGNOSIS — R1011 Right upper quadrant pain: Secondary | ICD-10-CM

## 2015-11-05 MED ORDER — IOPAMIDOL (ISOVUE-300) INJECTION 61%
100.0000 mL | Freq: Once | INTRAVENOUS | Status: AC | PRN
  Administered 2015-11-05: 100 mL via INTRAVENOUS

## 2016-07-13 NOTE — Patient Instructions (Addendum)
Your procedure is scheduled on:  Wednesday, 8/2  Enter through the Main Entrance at : 6 AM  Pick up desk phone and dial [phone] and inform us of your arrival.  Please call [phone] if you have any problems the morning of surgery.  Remember:  Do not eat food or drink liquids, including water, after midnight: Tuesday  You may brush your teeth the morning of surgery.  Take these meds the morning of surgery with a sip of water: Blood pressure pill and Paxil  DO NOT wear jewelry, eye make-up, lipstick,body lotion, or dark fingernail polish.  (Polished toes are ok) You may wear deodorant.  If you are to be admitted after surgery, leave suitcase in car until your room has been assigned. Patients discharged on the day of surgery will not be allowed to drive home. Wear loose fitting, comfortable clothes for your ride home.  Home with husband Alinda Money cell [phone]

## 2016-07-14 ENCOUNTER — Other Ambulatory Visit: Payer: Self-pay

## 2016-07-14 ENCOUNTER — Encounter (HOSPITAL_COMMUNITY)
Admission: RE | Admit: 2016-07-14 | Discharge: 2016-07-14 | Disposition: A | Discharge status: Home / Self Care | Payer: BLUE CROSS/BLUE SHIELD | Source: Ambulatory Visit | Attending: Obstetrics & Gynecology | Admitting: Obstetrics & Gynecology

## 2016-07-14 ENCOUNTER — Encounter (HOSPITAL_COMMUNITY): Payer: Self-pay

## 2016-07-14 DIAGNOSIS — Z01818 Encounter for other preprocedural examination: Secondary | ICD-10-CM | POA: Insufficient documentation

## 2016-07-14 DIAGNOSIS — Z0183 Encounter for blood typing: Secondary | ICD-10-CM | POA: Insufficient documentation

## 2016-07-14 DIAGNOSIS — Z01812 Encounter for preprocedural laboratory examination: Secondary | ICD-10-CM | POA: Diagnosis not present

## 2016-07-14 DIAGNOSIS — N920 Excessive and frequent menstruation with regular cycle: Secondary | ICD-10-CM | POA: Diagnosis not present

## 2016-07-14 HISTORY — DX: Major depressive disorder, single episode, unspecified: F32.9

## 2016-07-14 HISTORY — DX: Personal history of other medical treatment: Z92.89

## 2016-07-14 HISTORY — DX: Anemia, unspecified: D64.9

## 2016-07-14 HISTORY — DX: Depression, unspecified: F32.A

## 2016-07-14 LAB — CBC
HCT: 38.4 % (ref 36.0–46.0)
Hemoglobin: 13.1 g/dL (ref 12.0–15.0)
MCH: 29.8 pg (ref 26.0–34.0)
MCHC: 34.1 g/dL (ref 30.0–36.0)
MCV: 87.5 fL (ref 78.0–100.0)
PLATELETS: 377 10*3/uL (ref 150–400)
RBC: 4.39 MIL/uL (ref 3.87–5.11)
RDW: 13.4 % (ref 11.5–15.5)
WBC: 9.5 10*3/uL (ref 4.0–10.5)

## 2016-07-14 LAB — COMPREHENSIVE METABOLIC PANEL
ALT: 25 U/L (ref 14–54)
ANION GAP: 6 (ref 5–15)
AST: 21 U/L (ref 15–41)
Albumin: 4.1 g/dL (ref 3.5–5.0)
Alkaline Phosphatase: 47 U/L (ref 38–126)
BUN: 15 mg/dL (ref 6–20)
CHLORIDE: 103 mmol/L (ref 101–111)
CO2: 28 mmol/L (ref 22–32)
Calcium: 9.3 mg/dL (ref 8.9–10.3)
Creatinine, Ser: 0.71 mg/dL (ref 0.44–1.00)
Glucose, Bld: 89 mg/dL (ref 65–99)
POTASSIUM: 3.7 mmol/L (ref 3.5–5.1)
Sodium: 137 mmol/L (ref 135–145)
Total Bilirubin: 0.5 mg/dL (ref 0.3–1.2)
Total Protein: 7.2 g/dL (ref 6.5–8.1)

## 2016-07-14 LAB — TYPE AND SCREEN
ABO/RH(D): A POS
ANTIBODY SCREEN: NEGATIVE

## 2016-07-14 LAB — ABO/RH: ABO/RH(D): A POS

## 2016-07-14 NOTE — Pre-Procedure Instructions (Signed)
SDS BB History Log given to Lab for patient's history of blood transfusion at age 45 yrs old.

## 2016-07-16 NOTE — H&P (Signed)
Mary Pope is an 45 y.o. female with heavy, irregular menstrual bleeding.  The patient underwent hysteroscopy, D&C in 10/2015 with benign pathology and only temporary improvement in symptoms.  Patient has declined hormonal treatment.   Pertinent Gynecological History: Menses: flow is excessive with use of 7 pads or tampons on heaviest days Bleeding: dysfunctional uterine bleeding Contraception: vasectomy DES exposure: unknown Blood transfusions: none Sexually transmitted diseases: no past history Previous GYN Procedures: DNC  Last mammogram: normal Date: 07/2015 Last pap: normal Date: 06/2015 OB History: G3P3   Menstrual History: Menarche age: n/a Patient's last menstrual period was 07/09/2016 (exact date).    Past Medical History:  Diagnosis Date  . Anemia   . Depression   . Gallstones    Resolved - had surgery to remove gall bladder  . Headache(784.0)    MIGRAINES  . History of blood transfusion    [facility] at age 79 yrs old  . Hypertension   . Scoliosis    PT HAS 2 RODS IN HER BACK  . Seizures (HCC)    AS A CHILD AND THEN ONE OTHER SEIZURE AFTER 2004 DELIVERY OF CHILD --NO PROBLEMS SINCE.  Marland Kitchen SVD (spontaneous vaginal delivery)    x 3    Past Surgical History:  Procedure Laterality Date  . BREAST SURGERY Bilateral    multiple Bx's - all benign  . CHOLECYSTECTOMY N/A 01/31/2013   Procedure: LAPAROSCOPIC CHOLECYSTECTOMY WITH INTRAOPERATIVE CHOLANGIOGRAM;  Surgeon: Clovis Pu. Cornett, MD;  Location: [facility];  Service: General;  Laterality: N/A;  . SPINE SURGERY     herrington rods  . WISDOM TOOTH EXTRACTION      Family History  Problem Relation Age of Onset  . Cancer Mother     breast  . Cancer Maternal Grandfather     lung    Social History:  reports that she has never smoked. She has never used smokeless tobacco. She reports that she drinks alcohol. She reports that she does not use drugs.  Allergies: No Known Allergies  No prescriptions prior to  admission.    ROS  Last menstrual period 07/09/2016. Physical Exam  Constitutional: She is oriented to person, place, and time. She appears well-developed and well-nourished.  Cardiovascular: Normal rate and regular rhythm.   Respiratory: Effort normal and breath sounds normal.  GI: Soft. There is no rebound and no guarding.  Neurological: She is alert and oriented to person, place, and time.  Skin: Skin is warm and dry.  Psychiatric: She has a normal mood and affect. Her behavior is normal.    No results found for this or any previous visit (from the past 24 hour(s)).  No results found.  Assessment/Plan: 44yo with heavy, irregular menstrual bleeding -LAVH, bilateral salpingectomy -Patient is counseled re: risk of bleeding, infection, scarring, and damage to surrounding structures.  All questions were answered and the patient wishes to proceed.  [name] 07/16/2016, 7:47 PM

## 2016-07-16 NOTE — Anesthesia Preprocedure Evaluation (Addendum)
Anesthesia Evaluation  Patient identified by MRN, date of birth, ID band Patient awake    Reviewed: Allergy & Precautions, NPO status , Patient's Chart, lab work & pertinent test results  History of Anesthesia Complications Negative for: history of anesthetic complications  Airway Mallampati: III  TM Distance: >3 FB Neck ROM: Full    Dental no notable dental hx. (+) Dental Advisory Given   Pulmonary neg pulmonary ROS,    Pulmonary exam normal breath sounds clear to auscultation       Cardiovascular hypertension, Normal cardiovascular exam Rhythm:Regular Rate:Normal     Neuro/Psych  Headaches, Seizures - (as a child),  negative psych ROS   GI/Hepatic negative GI ROS, Neg liver ROS,   Endo/Other  negative endocrine ROS  Renal/GU negative Renal ROS  negative genitourinary   Musculoskeletal negative musculoskeletal ROS (+)   Abdominal   Peds negative pediatric ROS (+)  Hematology negative hematology ROS (+)   Anesthesia Other Findings   Reproductive/Obstetrics negative OB ROS                            Anesthesia Physical Anesthesia Plan  ASA: II  Anesthesia Plan: General   Post-op Pain Management:    Induction: Intravenous  Airway Management Planned: Oral ETT  Additional Equipment:   Intra-op Plan:   Post-operative Plan: Extubation in OR  Informed Consent: I have reviewed the patients History and Physical, chart, labs and discussed the procedure including the risks, benefits and alternatives for the proposed anesthesia with the patient or authorized representative who has indicated his/her understanding and acceptance.   Dental advisory given  Plan Discussed with: CRNA  Anesthesia Plan Comments:         Anesthesia Quick Evaluation

## 2016-07-19 ENCOUNTER — Observation Stay (HOSPITAL_COMMUNITY)
Admission: RE | Admit: 2016-07-19 | Discharge: 2016-07-20 | Disposition: A | Discharge status: Home / Self Care | Payer: BLUE CROSS/BLUE SHIELD | Source: Ambulatory Visit | Attending: Obstetrics & Gynecology | Admitting: Obstetrics & Gynecology

## 2016-07-19 ENCOUNTER — Encounter (HOSPITAL_COMMUNITY)
Admission: RE | Disposition: A | Discharge status: Home / Self Care | Payer: Self-pay | Source: Ambulatory Visit | Attending: Obstetrics & Gynecology

## 2016-07-19 ENCOUNTER — Ambulatory Visit (HOSPITAL_COMMUNITY): Payer: BLUE CROSS/BLUE SHIELD | Admitting: Anesthesiology

## 2016-07-19 ENCOUNTER — Encounter (HOSPITAL_COMMUNITY): Payer: Self-pay | Admitting: Anesthesiology

## 2016-07-19 DIAGNOSIS — I10 Essential (primary) hypertension: Secondary | ICD-10-CM | POA: Diagnosis not present

## 2016-07-19 DIAGNOSIS — Z79899 Other long term (current) drug therapy: Secondary | ICD-10-CM | POA: Diagnosis not present

## 2016-07-19 DIAGNOSIS — Z90710 Acquired absence of both cervix and uterus: Secondary | ICD-10-CM | POA: Diagnosis present

## 2016-07-19 DIAGNOSIS — N926 Irregular menstruation, unspecified: Secondary | ICD-10-CM | POA: Diagnosis present

## 2016-07-19 DIAGNOSIS — Z7982 Long term (current) use of aspirin: Secondary | ICD-10-CM | POA: Diagnosis not present

## 2016-07-19 HISTORY — PX: LAPAROSCOPIC VAGINAL HYSTERECTOMY WITH SALPINGECTOMY: SHX6680

## 2016-07-19 SURGERY — HYSTERECTOMY, VAGINAL, LAPAROSCOPY-ASSISTED, WITH SALPINGECTOMY
Anesthesia: General | Site: Abdomen | Laterality: Bilateral

## 2016-07-19 MED ORDER — ONDANSETRON HCL 4 MG PO TABS: 4.0000 mg | ORAL_TABLET | Freq: Four times a day (QID) | ORAL | Status: DC | PRN

## 2016-07-19 MED ORDER — SCOPOLAMINE 1 MG/3DAYS TD PT72
1.0000 | MEDICATED_PATCH | Freq: Once | TRANSDERMAL | Status: DC
  Administered 2016-07-19: 1.5 mg via TRANSDERMAL

## 2016-07-19 MED ORDER — PAROXETINE HCL 20 MG PO TABS
20.0000 mg | ORAL_TABLET | Freq: Every day | ORAL | Status: DC
  Administered 2016-07-20: 20 mg via ORAL
  Filled 2016-07-19: qty 1

## 2016-07-19 MED ORDER — SIMETHICONE 80 MG PO CHEW: 80.0000 mg | CHEWABLE_TABLET | Freq: Four times a day (QID) | ORAL | Status: DC | PRN

## 2016-07-19 MED ORDER — FENTANYL CITRATE (PF) 250 MCG/5ML IJ SOLN
INTRAMUSCULAR | Status: AC
  Filled 2016-07-19: qty 5

## 2016-07-19 MED ORDER — GLYCOPYRROLATE 0.2 MG/ML IJ SOLN
INTRAMUSCULAR | Status: DC | PRN
  Administered 2016-07-19: .1 mg via INTRAVENOUS

## 2016-07-19 MED ORDER — PROPOFOL 10 MG/ML IV BOLUS
INTRAVENOUS | Status: AC
  Filled 2016-07-19: qty 20

## 2016-07-19 MED ORDER — PHENYLEPHRINE 40 MCG/ML (10ML) SYRINGE FOR IV PUSH (FOR BLOOD PRESSURE SUPPORT)
PREFILLED_SYRINGE | INTRAVENOUS | Status: AC
  Filled 2016-07-19: qty 10

## 2016-07-19 MED ORDER — FENTANYL CITRATE (PF) 100 MCG/2ML IJ SOLN
INTRAMUSCULAR | Status: AC
  Filled 2016-07-19: qty 2

## 2016-07-19 MED ORDER — FENTANYL CITRATE (PF) 100 MCG/2ML IJ SOLN
25.0000 ug | INTRAMUSCULAR | Status: DC | PRN
  Administered 2016-07-19 (×3): 50 ug via INTRAVENOUS

## 2016-07-19 MED ORDER — KETOROLAC TROMETHAMINE 30 MG/ML IJ SOLN
INTRAMUSCULAR | Status: AC
  Filled 2016-07-19: qty 1

## 2016-07-19 MED ORDER — SUGAMMADEX SODIUM 200 MG/2ML IV SOLN
INTRAVENOUS | Status: AC
  Filled 2016-07-19: qty 2

## 2016-07-19 MED ORDER — MIDAZOLAM HCL 2 MG/2ML IJ SOLN
INTRAMUSCULAR | Status: AC
  Filled 2016-07-19: qty 2

## 2016-07-19 MED ORDER — HYDROMORPHONE HCL 1 MG/ML IJ SOLN
0.2000 mg | INTRAMUSCULAR | Status: DC | PRN
  Administered 2016-07-19: 0.6 mg via INTRAVENOUS
  Filled 2016-07-19 (×2): qty 1

## 2016-07-19 MED ORDER — LISINOPRIL-HYDROCHLOROTHIAZIDE 10-12.5 MG PO TABS: 1.0000 | ORAL_TABLET | Freq: Every day | ORAL | Status: DC

## 2016-07-19 MED ORDER — MENTHOL 3 MG MT LOZG: 1.0000 | LOZENGE | OROMUCOSAL | Status: DC | PRN

## 2016-07-19 MED ORDER — ONDANSETRON HCL 4 MG/2ML IJ SOLN
INTRAMUSCULAR | Status: AC
  Filled 2016-07-19: qty 2

## 2016-07-19 MED ORDER — LISINOPRIL 10 MG PO TABS
10.0000 mg | ORAL_TABLET | Freq: Every day | ORAL | Status: DC
  Administered 2016-07-20: 10 mg via ORAL
  Filled 2016-07-19: qty 1

## 2016-07-19 MED ORDER — ONDANSETRON HCL 4 MG/2ML IJ SOLN
INTRAMUSCULAR | Status: DC | PRN
  Administered 2016-07-19: 4 mg via INTRAVENOUS

## 2016-07-19 MED ORDER — BUPIVACAINE HCL (PF) 0.25 % IJ SOLN
INTRAMUSCULAR | Status: DC | PRN
  Administered 2016-07-19: 4 mL

## 2016-07-19 MED ORDER — DEXAMETHASONE SODIUM PHOSPHATE 10 MG/ML IJ SOLN
INTRAMUSCULAR | Status: DC | PRN
  Administered 2016-07-19: 8 mg via INTRAVENOUS

## 2016-07-19 MED ORDER — KETOROLAC TROMETHAMINE 30 MG/ML IJ SOLN: 30.0000 mg | Freq: Four times a day (QID) | INTRAMUSCULAR | Status: DC

## 2016-07-19 MED ORDER — GLYCOPYRROLATE 0.2 MG/ML IJ SOLN
INTRAMUSCULAR | Status: AC
  Filled 2016-07-19: qty 1

## 2016-07-19 MED ORDER — KETOROLAC TROMETHAMINE 30 MG/ML IJ SOLN
30.0000 mg | Freq: Four times a day (QID) | INTRAMUSCULAR | Status: DC
  Administered 2016-07-19 – 2016-07-20 (×4): 30 mg via INTRAVENOUS
  Filled 2016-07-19 (×4): qty 1

## 2016-07-19 MED ORDER — HYDROCHLOROTHIAZIDE 12.5 MG PO CAPS
12.5000 mg | ORAL_CAPSULE | Freq: Every day | ORAL | Status: DC
  Administered 2016-07-20: 12.5 mg via ORAL
  Filled 2016-07-19: qty 1

## 2016-07-19 MED ORDER — PHENYLEPHRINE HCL 10 MG/ML IJ SOLN
INTRAMUSCULAR | Status: DC | PRN
  Administered 2016-07-19: .04 mg via INTRAVENOUS
  Administered 2016-07-19 (×2): .08 mg via INTRAVENOUS
  Administered 2016-07-19 (×3): .04 mg via INTRAVENOUS
  Administered 2016-07-19: .08 mg via INTRAVENOUS

## 2016-07-19 MED ORDER — SCOPOLAMINE 1 MG/3DAYS TD PT72
MEDICATED_PATCH | TRANSDERMAL | Status: AC
  Administered 2016-07-19: 1.5 mg via TRANSDERMAL
  Filled 2016-07-19: qty 1

## 2016-07-19 MED ORDER — MIDAZOLAM HCL 2 MG/2ML IJ SOLN
INTRAMUSCULAR | Status: DC | PRN
  Administered 2016-07-19: 1 mg via INTRAVENOUS

## 2016-07-19 MED ORDER — ROCURONIUM BROMIDE 100 MG/10ML IV SOLN
INTRAVENOUS | Status: AC
  Filled 2016-07-19: qty 1

## 2016-07-19 MED ORDER — LIDOCAINE HCL (CARDIAC) 20 MG/ML IV SOLN
INTRAVENOUS | Status: AC
  Filled 2016-07-19: qty 5

## 2016-07-19 MED ORDER — ONDANSETRON HCL 4 MG/2ML IJ SOLN: 4.0000 mg | Freq: Four times a day (QID) | INTRAMUSCULAR | Status: DC | PRN

## 2016-07-19 MED ORDER — PROPOFOL 10 MG/ML IV BOLUS
INTRAVENOUS | Status: DC | PRN
  Administered 2016-07-19: 150 mg via INTRAVENOUS

## 2016-07-19 MED ORDER — ROCURONIUM BROMIDE 100 MG/10ML IV SOLN
INTRAVENOUS | Status: DC | PRN
Start: 2016-07-19 — End: 2016-07-19
  Administered 2016-07-19 (×3): 10 mg via INTRAVENOUS
  Administered 2016-07-19: 40 mg via INTRAVENOUS

## 2016-07-19 MED ORDER — LIDOCAINE HCL (CARDIAC) 20 MG/ML IV SOLN
INTRAVENOUS | Status: DC | PRN
  Administered 2016-07-19: 100 mg via INTRAVENOUS

## 2016-07-19 MED ORDER — BUPIVACAINE HCL (PF) 0.25 % IJ SOLN
INTRAMUSCULAR | Status: AC
  Filled 2016-07-19: qty 30

## 2016-07-19 MED ORDER — DOCUSATE SODIUM 100 MG PO CAPS
100.0000 mg | ORAL_CAPSULE | Freq: Two times a day (BID) | ORAL | Status: DC
  Administered 2016-07-19 – 2016-07-20 (×2): 100 mg via ORAL
  Filled 2016-07-19 (×2): qty 1

## 2016-07-19 MED ORDER — LACTATED RINGERS IR SOLN
Status: DC | PRN
  Administered 2016-07-19: 3000 mL

## 2016-07-19 MED ORDER — SUGAMMADEX SODIUM 200 MG/2ML IV SOLN
INTRAVENOUS | Status: DC | PRN
  Administered 2016-07-19: 150 mg via INTRAVENOUS

## 2016-07-19 MED ORDER — FENTANYL CITRATE (PF) 100 MCG/2ML IJ SOLN
INTRAMUSCULAR | Status: DC | PRN
  Administered 2016-07-19 (×3): 50 ug via INTRAVENOUS

## 2016-07-19 MED ORDER — DEXAMETHASONE SODIUM PHOSPHATE 10 MG/ML IJ SOLN
INTRAMUSCULAR | Status: AC
  Filled 2016-07-19: qty 1

## 2016-07-19 MED ORDER — KETOROLAC TROMETHAMINE 30 MG/ML IJ SOLN
INTRAMUSCULAR | Status: DC | PRN
Start: 2016-07-19 — End: 2016-07-19
  Administered 2016-07-19: 30 mg via INTRAVENOUS

## 2016-07-19 MED ORDER — LACTATED RINGERS IV SOLN: INTRAVENOUS | Status: DC

## 2016-07-19 MED ORDER — ONDANSETRON HCL 4 MG/2ML IJ SOLN: 4.0000 mg | Freq: Once | INTRAMUSCULAR | Status: DC | PRN

## 2016-07-19 MED ORDER — CEFAZOLIN SODIUM-DEXTROSE 2-4 GM/100ML-% IV SOLN
2.0000 g | INTRAVENOUS | Status: AC
  Administered 2016-07-19: 2 g via INTRAVENOUS

## 2016-07-19 MED ORDER — OXYCODONE-ACETAMINOPHEN 5-325 MG PO TABS: 1.0000 | ORAL_TABLET | ORAL | Status: DC | PRN

## 2016-07-19 MED ORDER — LACTATED RINGERS IV SOLN
INTRAVENOUS | Status: DC
  Administered 2016-07-19 (×3): via INTRAVENOUS

## 2016-07-19 MED ORDER — LACTATED RINGERS IV SOLN
INTRAVENOUS | Status: DC
  Administered 2016-07-19 (×2): via INTRAVENOUS

## 2016-07-19 SURGICAL SUPPLY — 40 items
CABLE HIGH FREQUENCY MONO STRZ (ELECTRODE) IMPLANT
CANISTER SUCT 3000ML (MISCELLANEOUS) ×3 IMPLANT
CATH ROBINSON RED A/P 16FR (CATHETERS) ×3 IMPLANT
CLOTH BEACON ORANGE TIMEOUT ST (SAFETY) ×3 IMPLANT
COVER BACK TABLE 60X90IN (DRAPES) ×3 IMPLANT
DECANTER SPIKE VIAL GLASS SM (MISCELLANEOUS) ×2 IMPLANT
DRSG COVADERM PLUS 2X2 (GAUZE/BANDAGES/DRESSINGS) ×2 IMPLANT
DRSG OPSITE POSTOP 3X4 (GAUZE/BANDAGES/DRESSINGS) ×2 IMPLANT
DURAPREP 26ML APPLICATOR (WOUND CARE) ×3 IMPLANT
ELECT LIGASURE LONG (ELECTRODE) IMPLANT
ELECT REM PT RETURN 9FT ADLT (ELECTROSURGICAL) ×3
ELECTRODE REM PT RTRN 9FT ADLT (ELECTROSURGICAL) IMPLANT
FILTER SMOKE EVAC LAPAROSHD (FILTER) ×1 IMPLANT
FORCEPS CUTTING 45CM 5MM (CUTTING FORCEPS) ×2 IMPLANT
GLOVE BIO SURGEON STRL SZ 6 (GLOVE) ×6 IMPLANT
GLOVE BIOGEL PI IND STRL 6 (GLOVE) ×2 IMPLANT
GLOVE BIOGEL PI IND STRL 7.0 (GLOVE) ×3 IMPLANT
GLOVE BIOGEL PI INDICATOR 6 (GLOVE) ×4
GLOVE BIOGEL PI INDICATOR 7.0 (GLOVE) ×8
LEGGING LITHOTOMY PAIR STRL (DRAPES) ×3 IMPLANT
MANIPULATOR UTERINE 4.5 ZUMI (MISCELLANEOUS) IMPLANT
NEEDLE INSUFFLATION 120MM (ENDOMECHANICALS) ×3 IMPLANT
NS IRRIG 1000ML POUR BTL (IV SOLUTION) ×5 IMPLANT
PACK LAVH (CUSTOM PROCEDURE TRAY) ×3 IMPLANT
PACK ROBOTIC GOWN (GOWN DISPOSABLE) ×3 IMPLANT
PAD TRENDELENBURG POSITION (MISCELLANEOUS) ×3 IMPLANT
SEALER TISSUE G2 CVD JAW 45CM (ENDOMECHANICALS) IMPLANT
SET IRRIG TUBING LAPAROSCOPIC (IRRIGATION / IRRIGATOR) ×2 IMPLANT
SLEEVE XCEL OPT CAN 5 100 (ENDOMECHANICALS) ×3 IMPLANT
SUT MNCRL 0 MO-4 VIOLET 18 CR (SUTURE) ×2 IMPLANT
SUT MNCRL AB 3-0 PS2 27 (SUTURE) ×3 IMPLANT
SUT MON AB 2-0 CT1 36 (SUTURE) ×3 IMPLANT
SUT MONOCRYL 0 MO 4 18  CR/8 (SUTURE) ×8
SUT VICRYL 0 TIES 12 18 (SUTURE) ×3 IMPLANT
SUT VICRYL 0 UR6 27IN ABS (SUTURE) ×3 IMPLANT
TOWEL OR 17X24 6PK STRL BLUE (TOWEL DISPOSABLE) ×6 IMPLANT
TRAY FOLEY CATH SILVER 14FR (SET/KITS/TRAYS/PACK) ×3 IMPLANT
TROCAR XCEL NON-BLD 11X100MML (ENDOMECHANICALS) ×3 IMPLANT
TROCAR XCEL NON-BLD 5MMX100MML (ENDOMECHANICALS) ×3 IMPLANT
WARMER LAPAROSCOPE (MISCELLANEOUS) ×3 IMPLANT

## 2016-07-19 NOTE — Progress Notes (Signed)
No change to H&P.  Adalena Abdulla, DO 

## 2016-07-19 NOTE — Transfer of Care (Signed)
Immediate Anesthesia Transfer of Care Note  Patient: Mary Pope  Procedure(s) Performed: Procedure(s): LAPAROSCOPIC ASSISTED VAGINAL HYSTERECTOMY WITH SALPINGECTOMY (Bilateral)  Patient Location: PACU  Anesthesia Type:General  Level of Consciousness: awake, alert  and patient cooperative  Airway & Oxygen Therapy: Patient Spontanous Breathing and Patient connected to nasal cannula oxygen  Post-op Assessment: Report given to RN and Post -op Vital signs reviewed and stable  Post vital signs: Reviewed and stable  Last Vitals:  Vitals:   07/19/16 0618  BP: 113/78  Pulse: 75  Resp: 16  Temp: 36.6 C    Last Pain:  Vitals:   07/19/16 0618  TempSrc: Oral      Patients Stated Pain Goal: 4 (07/19/16 0618)  Complications: No apparent anesthesia complications

## 2016-07-19 NOTE — Anesthesia Postprocedure Evaluation (Signed)
Anesthesia Post Note  Patient: Mary Pope  Procedure(s) Performed: Procedure(s) (LRB): LAPAROSCOPIC ASSISTED VAGINAL HYSTERECTOMY WITH SALPINGECTOMY (Bilateral)  Patient location during evaluation: [facility] Anesthesia Type: General Level of consciousness: awake, awake and alert, oriented and patient cooperative Pain management: pain level controlled Vital Signs Assessment: post-procedure vital signs reviewed and stable Respiratory status: spontaneous breathing, nonlabored ventilation and respiratory function stable Cardiovascular status: stable Postop Assessment: no headache, patient able to bend at knees, no backache and no signs of nausea or vomiting Anesthetic complications: no     Last Vitals:  Vitals:   07/19/16 1140 07/19/16 1242  BP: 108/61 113/68  Pulse: 84 95  Resp: 18 18  Temp: 36.9 C 36.9 C    Last Pain:  Vitals:   07/19/16 1242  TempSrc: Oral  PainSc: 5    Pain Goal: Patients Stated Pain Goal: 4 (07/19/16 1242)               Sarabelle Genson L

## 2016-07-19 NOTE — Anesthesia Procedure Notes (Addendum)
Procedure Name: Intubation Date/Time: 07/19/2016 7:35 AM Performed by: Earmon Phoenix Pre-anesthesia Checklist: Patient identified, Timeout performed, Emergency Drugs available, Suction available and Patient being monitored Patient Re-evaluated:Patient Re-evaluated prior to inductionOxygen Delivery Method: Circle system utilized Preoxygenation: Pre-oxygenation with 100% oxygen Intubation Type: IV induction Ventilation: Mask ventilation without difficulty and Oral airway inserted - appropriate to patient size Laryngoscope Size: Mac and 3 Grade View: Grade II Tube type: Oral Tube size: 7.0 mm Number of attempts: 1 Airway Equipment and Method: Stylet Placement Confirmation: ETT inserted through vocal cords under direct vision,  positive ETCO2,  CO2 detector and breath sounds checked- equal and bilateral Secured at: 21 cm Tube secured with: Tape Dental Injury: Teeth and Oropharynx as per pre-operative assessment

## 2016-07-19 NOTE — Addendum Note (Signed)
Addendum  created 07/19/16 1415 by Yolonda Kida, CRNA   Sign clinical note

## 2016-07-19 NOTE — Op Note (Signed)
PROCEDURE DATE: 07/19/2016 PREOPERATIVE DIAGNOSIS: Menorrhagia, dysmenorrhea  POSTOPERATIVE DIAGNOSIS: The same  PROCEDURE: Laparoscopic Assisted Vaginal Hysterectomy, Bilateral salpingectomy SURGEON: Dr. Mitchel Honour  ASSISTANT: Dr. Marcelle Overlie INDICATIONS: 45 y.o. G3P3 with menorrhagia and dysmenorrhea desiring definitive surgical management. Risks of surgery were discussed with the patient including but not limited to: bleeding which may require transfusion or reoperation; infection which may require antibiotics; injury to bowel, bladder, ureters or other surrounding organs; need for additional procedures including laparotomy; thromboembolic phenomenon, incisional problems and other postoperative/anesthesia complications. Written informed consent was obtained.  FINDINGS: Enlarged fibroid uterus, normal adnexa bilaterally. No evidence of endometriosis. Normal upper abdomen (s/p cholecystectomy) and appendix.   ANESTHESIA: General  ESTIMATED BLOOD LOSS: 300 ml  SPECIMENS: Uterus and cervix  COMPLICATIONS: None immediate  PROCEDURE IN DETAIL: The patient received intravenous antibiotics and had sequential compression devices applied to her lower extremities while in the preoperative area. She was then taken to the operating room where general anesthesia was administered and was found to be adequate. She was placed in the dorsal lithotomy position, and was prepped and draped in a sterile manner. An in and out catheterization was performed. A uterine manipulator was then advanced into the uterus . After an adequate timeout was performed, attention was then turned to the patient's abdomen where a 10-mm skin incision was made in the umbilical fold. The Veress needle was carefully introduced into the peritoneal cavity through the abdominal wall. Intraperitoneal placement was confirmed by drop in intraabdominal pressure with insufflation of carbon dioxide gas. Adequate pneumoperitoneum was obtained, and  the 10/11 XL trocar and sleeve were then advanced without difficulty into the abdomen where intraabdominal placement was confirmed by the laparoscope. A survey of the patient's pelvis and abdomen revealed the above anatomic findings. Suprapubic 5 XL port was then placed under direct visualization. The pelvis was then carefully examined. On the right side, the fallopian tube was elevated and freed from the mesosalpinx using the Gyrus in serial clamp, coagulate, cut fashion.  The round ligament was then clamped and transected with the Gyrus. The uteroovarian ligament was also clamped and transected. The leaves of the broad ligament were separated and serially transected.  These procedures were then repeated on the left side. The ureters were noted to be safely away from the area of dissection.  At this point, attention was turned to the vaginal portion of the case. A weighted speculum was placed posteriorly, a Deaver anteriorly, and the cervix grasped with a thyroid tenaculum. Once the anterior and posterior reflections were identified, the cervix was circumscribed using the Bovie knife. Next, using Mayos, the posterior cul-de-sac was entered. The Haney clamp was then used to grasp the uterosacrals which were cut and suture ligated using 0 Vicryl. Next, the bladder reflection was identified. Using Metzenbaums, it was entered and palpation and direct visualization confirmed proper location. Next, the uterine arteries were clamped, cut and suture ligated bilaterally. The pedicles were visualized and hemostatic. The same was performed sequentially cephalad until the uterus and cervix were removed. The pedicles were inspected and found to be hemostatic.  Next, the uterosacrals were tagged with monocryl bilaterally.  The uterosacrals were brought together in the midline cuff closure with a figure-of-8 stitch using monocryl followed by the remainder of the cuff closure in the same fashion. The cuff was inspected and found  to be hemostatic.  Attention was returned to the abdomen were a second laparoscopic look was taken. All pedicles were hemostatic. There were a few oozing  vessels near the bladder flap that were rendered hemostatic using the Gyrus. Insufflation was removed after all instruments were removed. The infraumbilical fascial incision was closed with 0 vicryl in a figure of eight stitch.  All skin incisions were closed with 4-0 monocryl subcuticular stitches and Dermabond. The patient tolerated the procedures well. All instruments, needles, and sponge counts were correct x 2. The patient was taken to the recovery room awake, extubated and in stable condition.

## 2016-07-19 NOTE — Progress Notes (Signed)
NOS  No c/o.  Tolerating clears.  No n/v.  Pain well controlled.  No CP/SOB.  VSS.  UOP >50cc/hr  Gen: A&O x 3 Abd: soft, ND, dressing c/d. Ext: no c/c/e  POD#0 s/p LAVH -Advance diet -Ambulate -AM labs pending -D/C IVF and foley in AM  Mitchel Honour, DO

## 2016-07-19 NOTE — Anesthesia Postprocedure Evaluation (Signed)
Anesthesia Post Note  Patient: Mary Pope  Procedure(s) Performed: Procedure(s) (LRB): LAPAROSCOPIC ASSISTED VAGINAL HYSTERECTOMY WITH SALPINGECTOMY (Bilateral)  Patient location during evaluation: PACU Anesthesia Type: General Level of consciousness: awake and alert Pain management: pain level controlled Vital Signs Assessment: post-procedure vital signs reviewed and stable Respiratory status: spontaneous breathing, nonlabored ventilation, respiratory function stable and patient connected to nasal cannula oxygen Cardiovascular status: blood pressure returned to baseline and stable Postop Assessment: no signs of nausea or vomiting Anesthetic complications: no     Last Vitals:  Vitals:   07/19/16 1140 07/19/16 1242  BP: 108/61 113/68  Pulse: 84 95  Resp: 18 18  Temp: 36.9 C 36.9 C    Last Pain:  Vitals:   07/19/16 1242  TempSrc: Oral  PainSc: 5    Pain Goal: Patients Stated Pain Goal: 4 (07/19/16 1242)               Kiam Bransfield JENNETTE

## 2016-07-20 ENCOUNTER — Encounter (HOSPITAL_COMMUNITY): Payer: Self-pay | Admitting: Obstetrics & Gynecology

## 2016-07-20 DIAGNOSIS — N926 Irregular menstruation, unspecified: Secondary | ICD-10-CM | POA: Diagnosis not present

## 2016-07-20 LAB — CBC
HEMATOCRIT: 32.8 % — AB (ref 36.0–46.0)
HEMOGLOBIN: 11 g/dL — AB (ref 12.0–15.0)
MCH: 29.3 pg (ref 26.0–34.0)
MCHC: 33.5 g/dL (ref 30.0–36.0)
MCV: 87.5 fL (ref 78.0–100.0)
PLATELETS: 299 10*3/uL (ref 150–400)
RBC: 3.75 MIL/uL — AB (ref 3.87–5.11)
RDW: 13.7 % (ref 11.5–15.5)
WBC: 16.6 10*3/uL — ABNORMAL HIGH (ref 4.0–10.5)

## 2016-07-20 LAB — COMPREHENSIVE METABOLIC PANEL
ALK PHOS: 37 U/L — AB (ref 38–126)
ALT: 17 U/L (ref 14–54)
AST: 20 U/L (ref 15–41)
Albumin: 3.2 g/dL — ABNORMAL LOW (ref 3.5–5.0)
Anion gap: 5 (ref 5–15)
BILIRUBIN TOTAL: 0.6 mg/dL (ref 0.3–1.2)
BUN: 17 mg/dL (ref 6–20)
CALCIUM: 8.6 mg/dL — AB (ref 8.9–10.3)
CO2: 25 mmol/L (ref 22–32)
CREATININE: 0.6 mg/dL (ref 0.44–1.00)
Chloride: 104 mmol/L (ref 101–111)
Glucose, Bld: 126 mg/dL — ABNORMAL HIGH (ref 65–99)
Potassium: 3.7 mmol/L (ref 3.5–5.1)
Sodium: 134 mmol/L — ABNORMAL LOW (ref 135–145)
Total Protein: 5.8 g/dL — ABNORMAL LOW (ref 6.5–8.1)

## 2016-07-20 MED ORDER — IBUPROFEN 800 MG PO TABS: 800.0000 mg | ORAL_TABLET | Freq: Four times a day (QID) | ORAL | 1 refills | Status: AC | PRN

## 2016-07-20 MED ORDER — OXYCODONE-ACETAMINOPHEN 5-325 MG PO TABS: 1.0000 | ORAL_TABLET | ORAL | 0 refills | Status: AC | PRN

## 2016-07-20 NOTE — Progress Notes (Signed)
Discharge instructions given. Patient verbalizes and understanding. Pt is waiting for a ride at this time.

## 2016-07-20 NOTE — Discharge Instructions (Signed)
Call MD for T>100.4, heavy vaginal bleeding, severe abdominal pain, intractable nausea and/or vomiting, or respiratory distress.  Call office to schedule postop appointment in 2 weeks.  No driving while taking narcotics.  Pelvic rest x 6 weeks. °

## 2016-07-20 NOTE — Discharge Summary (Signed)
Physician Discharge Summary  Patient ID: SANAIA PINILLA MRN: [id number] DOB/AGE: 46-Feb-1972 44 y.o.  Admit date: 07/19/2016 Discharge date: 07/20/2016  Admission Diagnoses: Heavy, irregular vaginal bleeding  Discharge Diagnoses: SAA Active Problems:   S/P hysterectomy   Discharged Condition: good  Hospital Course: The patient was admitted for LAVH, b/l salpingectomy to treat her heavy, irregular vaginal bleeding definitively.  She underwent the anticipated procedure without complications.  On POD#1 she was meeting all goals and was discharged home after an uncomplicated postop course.  Consults: None  Significant Diagnostic Studies: none  Treatments: surgery: LAVH, bilateral salpingectomy  Discharge Exam: Blood pressure (!) 104/59, pulse 87, temperature 98.9 F (37.2 C), temperature source Oral, resp. rate 16, last menstrual period 07/09/2016, SpO2 97 %. General appearance: alert, cooperative and appears stated age GI: soft, approp ttp Extremities: no c/c/e Incision/Wound: c/d/i x 2  Disposition: 01-Home or Self Care     Medication List    STOP taking these medications   ferrous sulfate dried 160 (50 Fe) MG Tbcr SR tablet Commonly known as:  SLOW FE     TAKE these medications   GOODY HEADACHE PO Take 1 Dose by mouth daily as needed (For headaches.).   ibuprofen 800 MG tablet Commonly known as:  ADVIL Take 1 tablet (800 mg total) by mouth every 6 (six) hours as needed.   lisinopril-hydrochlorothiazide 10-12.5 MG tablet Commonly known as:  PRINZIDE,ZESTORETIC Take 1 tablet by mouth daily.   oxyCODONE-acetaminophen 5-325 MG tablet Commonly known as:  PERCOCET/ROXICET Take 1-2 tablets by mouth every 4 (four) hours as needed (moderate to severe pain (when tolerating fluids)).   PARoxetine 20 MG tablet Commonly known as:  PAXIL Take 20 mg by mouth daily.   SUMAtriptan 100 MG tablet Commonly known as:  IMITREX Take 100 mg by mouth every 2 (two) hours as needed.  For headaches        Signed: Mitchel Honour 07/20/2016, 9:25 AM

## 2016-07-20 NOTE — Progress Notes (Signed)
Patient is feeling well and ready to go home.  She is tolerating po, voiding without difficulty, ambulating the hallway, pain is well controlled and passing flatus.  VSS. AF. UOP adequate Labs reviewed and wnl  Gen: A&O x 3 Abd: soft, approp ttp, inc c/d Ext: no c/c/e  POD#1 s/p LAVH -D/C home -F/U in office in 2 weeks  Mitchel Honour, DO

## 2018-07-22 ENCOUNTER — Other Ambulatory Visit: Payer: Self-pay | Admitting: Orthopedic Surgery

## 2018-07-22 DIAGNOSIS — M4722 Other spondylosis with radiculopathy, cervical region: Secondary | ICD-10-CM

## 2018-08-16 ENCOUNTER — Ambulatory Visit
Admission: RE | Admit: 2018-08-16 | Discharge: 2018-08-16 | Disposition: A | Discharge status: Home / Self Care | Payer: BLUE CROSS/BLUE SHIELD | Source: Ambulatory Visit | Attending: Orthopedic Surgery | Admitting: Orthopedic Surgery

## 2018-08-16 DIAGNOSIS — M4722 Other spondylosis with radiculopathy, cervical region: Secondary | ICD-10-CM

## 2018-09-03 ENCOUNTER — Ambulatory Visit: Payer: BLUE CROSS/BLUE SHIELD | Admitting: Physical Therapy

## 2022-03-30 ENCOUNTER — Ambulatory Visit: Payer: BC Managed Care – PPO | Attending: Orthopedic Surgery

## 2022-03-30 DIAGNOSIS — M5459 Other low back pain: Secondary | ICD-10-CM | POA: Diagnosis present

## 2022-03-30 NOTE — Therapy (Signed)
[place] [facility] ?[address] ?[place], [place], [address] ?Phone: [phone]   Fax:  [phone] ? ?Physical Therapy Evaluation ? ?Patient Details  ?Name: Mary Pope ?MRN: [id number] ?Date of Birth: 01/24/1971 ?Referring Provider (PT): Mahar, PA-C ? ? ?Encounter Date: 03/30/2022 ? ? PT End of Session - 03/30/22 1301   ? ? Visit Number 1   ? Number of Visits 12   ? Date for PT Re-Evaluation 05/12/22   ? PT Start Time 1302   ? PT Stop Time 1342   ? PT Time Calculation (min) 40 min   ? Activity Tolerance Patient tolerated treatment well   ? Behavior [facility] for tasks assessed/performed   ? ?  ?  ? ?  ? ? ?Past Medical History:  ?Diagnosis Date  ? Anemia   ? Depression   ? Gallstones   ? Resolved - had surgery to remove gall bladder  ? Headache(784.0)   ? MIGRAINES  ? History of blood transfusion   ? [facility] at age 51 yrs old  ? Hypertension   ? Scoliosis   ? PT HAS 2 RODS IN HER BACK  ? Seizures (HCC)   ? AS A CHILD AND THEN ONE OTHER SEIZURE AFTER 2004 DELIVERY OF CHILD --NO PROBLEMS SINCE.  ? SVD (spontaneous vaginal delivery)   ? x 3  ? ? ?Past Surgical History:  ?Procedure Laterality Date  ? BREAST SURGERY Bilateral   ? multiple Bx's - all benign  ? CHOLECYSTECTOMY N/A 01/31/2013  ? Procedure: LAPAROSCOPIC CHOLECYSTECTOMY WITH INTRAOPERATIVE CHOLANGIOGRAM;  Surgeon: Clovis Puhomas A. Cornett, MD;  Location: [facility];  Service: General;  Laterality: N/A;  ? LAPAROSCOPIC VAGINAL HYSTERECTOMY WITH SALPINGECTOMY Bilateral 07/19/2016  ? Procedure: LAPAROSCOPIC ASSISTED VAGINAL HYSTERECTOMY WITH SALPINGECTOMY;  Surgeon: Mitchel HonourMegan Morris, DO;  Location: [facility];  Service: Gynecology;  Laterality: Bilateral;  ? SPINE SURGERY    ? herrington rods  ? WISDOM TOOTH EXTRACTION    ? ? ?There were no vitals filed for this visit. ? ? ? Subjective Assessment - 03/30/22 1301   ? ? Subjective Patient reports that her back has been bothering her for a long time. She notes that her pain  has been steadily getting worse over the last few months. She had an injection last week which helped reduce her back pain and eliminate the pain radiating down both legs. She notes that she has never had any pain like this before. She is not currently working as her physicain pulled her out from work.   ? Pertinent History previous lumbar surgery   ? Limitations Sitting   ? How long can you sit comfortably? "not long"   ? Patient Stated Goals reduced back pain   ? Currently in Pain? Yes   ? Pain Score 8    ? Pain Location Back   ? Pain Orientation Left;Lower   ? Pain Descriptors / Indicators Aching;Sharp   ? Pain Type Chronic pain   ? Pain Radiating Towards bilaterally down the back of the legs to the feet   ? Pain Onset More than a month ago   ? Pain Frequency Intermittent   ? Aggravating Factors  sitting, lifting   ? Pain Relieving Factors ice   ? Effect of Pain on Daily Activities must lay down after work due to her back pain   ? ?  ?  ? ?  ? ? ? ? ? [facility] PT Assessment - 03/30/22 0001   ? ?  ?  Assessment  ? Medical Diagnosis Scoliosis   ? Referring Provider (PT) Mahar, PA-C   ? Onset Date/Surgical Date --   years  ? Next MD Visit 05/01/22   ? Prior Therapy No   ?  ? Precautions  ? Precautions None   ?  ? Restrictions  ? Weight Bearing Restrictions No   ?  ? Balance Screen  ? Has the patient fallen in the past 6 months No   ? Has the patient had a decrease in activity level because of a fear of falling?  No   ? Is the patient reluctant to leave their home because of a fear of falling?  No   ?  ? Home Environment  ? Living Environment Private residence   ?  ? Prior Function  ? Level of Independence Independent   ? Vocation Full time employment;On disability   ? Vocation Requirements lifting up to 50 pounds   ? Leisure none at this time   ?  ? Cognition  ? Overall Cognitive Status Within Functional Limits for tasks assessed   ? Memory Appears intact   ? Awareness Appears intact   ? Problem Solving Appears intact   ?   ? Sensation  ? Additional Comments Patient reports no numbness or tingling   ?  ? Posture/Postural Control  ? Posture/Postural Control Postural limitations   ? Postural Limitations Rounded Shoulders;Forward head;Flexed trunk   ?  ? ROM / Strength  ? AROM / PROM / Strength Strength;AROM   ?  ? AROM  ? Overall AROM Comments B hip PROM: WFL   ? AROM Assessment Site Lumbar   ? Lumbar Flexion 46   ? Lumbar Extension 16   ? Lumbar - Right Side Bend 75% limited   tight  ? Lumbar - Left Side Bend 75% limited   tight  ? Lumbar - Right Rotation 25% limited   right sided tightness  ? Lumbar - Left Rotation 50% limited   ?  ? Strength  ? Strength Assessment Site Ankle;Knee;Hip   ? Right/Left Hip Right;Left   ? Right Hip Flexion 4/5   ? Left Hip Flexion 4/5   ? Right/Left Knee Right;Left   ? Right Knee Flexion 4-/5   ? Right Knee Extension 5/5   ? Left Knee Flexion 4/5   ? Left Knee Extension 4+/5   ? Right/Left Ankle Right;Left   ? Right Ankle Dorsiflexion 4/5   ? Left Ankle Dorsiflexion 4/5   ?  ? Palpation  ? Spinal mobility Lumbar spine: hypomobile and nonpainful   ? Palpation comment No signifcant tenderness to palpation   ?  ? Special Tests  ?  Special Tests Lumbar   ? Lumbar Tests Straight Leg Raise   ?  ? Straight Leg Raise  ? Findings Negative   ? ?  ?  ? ?  ? ? ? ? ? ? ? ? ? ? ? ? ? ?Objective measurements completed on examination: See above findings.  ? ? ? ? ? [facility] Adult PT Treatment/Exercise - 03/30/22 0001   ? ?  ? Exercises  ? Exercises Lumbar   ?  ? Lumbar Exercises: Stretches  ? Lower Trunk Rotation Other (comment)   15 reps  ? Other Lumbar Stretch Exercise Ball roll out   flexion  ?  ? Lumbar Exercises: Supine  ? Glut Set 20 reps;5 seconds   ? Bridge Limitations attempted, but modified to glute sets due to pain   ?  Straight Leg Raise 10 reps   ? ?  ?  ? ?  ? ? ? ? ? ? ? ? ? ? ? ? ? ? ? PT Long Term Goals - 03/30/22 1347   ? ?  ? PT LONG TERM GOAL #1  ? Title Patient will be independent with her HEP.   ? Time 6    ? Period Weeks   ? Status New   ? Target Date 05/11/22   ?  ? PT LONG TERM GOAL #2  ? Title Patient will be able clean her house without her familiar pain exceeding 5/10.   ? Time 6   ? Period Weeks   ? Status New   ? Target Date 05/11/22   ?  ? PT LONG TERM GOAL #3  ? Title Patient will be able to lift at least 20 pounds for improved function returning to her critical job demands.   ? Time 6   ? Period Weeks   ? Status New   ? Target Date 05/11/22   ? ?  ?  ? ?  ? ? ? ? ? ? ? ? ? Plan - 03/30/22 1518   ? ? Clinical Impression Statement Patient is a 51 year old female presenting to physical therapy with chronic low back pain. She presented to treatment with low to moderate pain severity and irritability. Her familiar symptoms were unable to be reproduced with any of today's testing. However,she exhibited reduced lumbar AROM and lower extremity weakness. Recommend that she continue with her recommended plan of care to address her remaining impairments to maximize her functional mobility.   ? Personal Factors and Comorbidities Time since onset of injury/illness/exacerbation   ? Examination-Activity Limitations Sit;Stand   ? Examination-Participation Restrictions Occupation;Community Activity   ? Stability/Clinical Decision Making Evolving/Moderate complexity   ? Clinical Decision Making Moderate   ? Rehab Potential Fair   ? PT Frequency 2x / week   ? PT Duration 6 weeks   ? PT Treatment/Interventions Electrical Stimulation;Cryotherapy;Moist Heat;Neuromuscular re-education;Therapeutic exercise;Therapeutic activities;Functional mobility training;Patient/family education;Manual techniques;Passive range of motion;Taping   ? PT Next Visit Plan nustep, core strengthening progression   ? PT Home Exercise Plan Access Code: [email or web address]  URL: [email or web address]  Date: 03/30/2022  Prepared by: Candi Leash    Exercises  - Lower Trunk Rotations  - 2 x daily - 7 x weekly - 2 sets - 10 reps  - Hooklying Gluteal  Sets  - 2 x daily - 7 x weekly - 2 sets - 10 reps - 5 seconds  hold  - Supine Active Straight Leg Raise  - 2 x daily - 7 x weekly - 2 sets - 10 reps   ? Consulted and Agree with Plan of Care Patient   ?

## 2022-04-03 ENCOUNTER — Ambulatory Visit: Payer: BC Managed Care – PPO

## 2022-04-03 DIAGNOSIS — M5459 Other low back pain: Secondary | ICD-10-CM | POA: Diagnosis not present

## 2022-04-03 NOTE — Therapy (Signed)
[place] ?[facility] ?[address] ?[place], [place], [address] ?Phone: [phone]   Fax:  [phone] ? ?Physical Therapy Treatment ? ?Patient Details  ?Name: Mary Pope ?MRN: [id number] ?Date of Birth: 03/12/1971 ?Referring Provider (PT): Mahar, PA-C ? ? ?Encounter Date: 04/03/2022 ? ? PT End of Session - 04/03/22 1438   ? ? Visit Number 2   ? Number of Visits 12   ? Date for PT Re-Evaluation 05/12/22   ? PT Start Time 1432   ? PT Stop Time 1516   ? PT Time Calculation (min) 44 min   ? Activity Tolerance Patient tolerated treatment well   ? Behavior During Therapy [facility] for tasks assessed/performed   ? ?  ?  ? ?  ? ? ?Past Medical History:  ?Diagnosis Date  ? Anemia   ? Depression   ? Gallstones   ? Resolved - had surgery to remove gall bladder  ? Headache(784.0)   ? MIGRAINES  ? History of blood transfusion   ? [facility] at age 51 yrs old  ? Hypertension   ? Scoliosis   ? PT HAS 2 RODS IN HER BACK  ? Seizures (HCC)   ? AS A CHILD AND THEN ONE OTHER SEIZURE AFTER 2004 DELIVERY OF CHILD --NO PROBLEMS SINCE.  ? SVD (spontaneous vaginal delivery)   ? x 3  ? ? ?Past Surgical History:  ?Procedure Laterality Date  ? BREAST SURGERY Bilateral   ? multiple Bx's - all benign  ? CHOLECYSTECTOMY N/A 01/31/2013  ? Procedure: LAPAROSCOPIC CHOLECYSTECTOMY WITH INTRAOPERATIVE CHOLANGIOGRAM;  Surgeon: Clovis Puhomas A. Cornett, MD;  [facility];  Service: General;  Laterality: N/A;  ? LAPAROSCOPIC VAGINAL HYSTERECTOMY WITH SALPINGECTOMY Bilateral 07/19/2016  ? Procedure: LAPAROSCOPIC ASSISTED VAGINAL HYSTERECTOMY WITH SALPINGECTOMY;  Surgeon: Mitchel HonourMegan Morris, DO;  Location: [facility];  Service: Gynecology;  Laterality: Bilateral;  ? SPINE SURGERY    ? herrington rods  ? WISDOM TOOTH EXTRACTION    ? ? ?There were no vitals filed for this visit. ? ? Subjective Assessment - 04/03/22 1436   ? ? Subjective Patient reports that her back is sore and hurting today. She notes that lower trunk rotations  started bothering the outside of her right leg.   ? Pertinent History previous lumbar surgery   ? Limitations Sitting   ? How long can you sit comfortably? "not long"   ? Patient Stated Goals reduced back pain   ? Currently in Pain? Yes   ? Pain Score 5    ? Pain Location Back   ? Pain Onset More than a month ago   ? ?  ?  ? ?  ? ? ? ? ? ? ? ? ? ? ? ? ? ? ? ? ? ? ? ? [facility] Adult PT Treatment/Exercise - 04/03/22 0001   ? ?  ? Lumbar Exercises: Stretches  ? Other Lumbar Stretch Exercise Flexion with rotation   limited due to pain  ? Other Lumbar Stretch Exercise Ball roll out   with rotation  ?  ? Lumbar Exercises: Aerobic  ? Nustep L3 x 12 minutes   ?  ? Lumbar Exercises: Standing  ? Other Standing Lumbar Exercises Ball press   5 second hold; 20 reps  ? Other Standing Lumbar Exercises Rocker board   3 minutes  ?  ? Modalities  ? Modalities Electrical Stimulation;Cryotherapy   ?  ? Cryotherapy  ? Number Minutes Cryotherapy 15 Minutes   ? Cryotherapy Location Lumbar Spine   ?  Type of Cryotherapy Ice pack   ?  ? Electrical Stimulation  ? Electrical Stimulation Location left low back   no redness or adverse reaction with todays modalities  ? Electrical Stimulation Action pre mod   ? Electrical Stimulation Parameters 80-150 Hz x 15 minutes   ? Electrical Stimulation Goals Pain   ? ?  ?  ? ?  ? ? ? ? ? ? ? ? ? ? ? ? ? ? ? PT Long Term Goals - 03/30/22 1347   ? ?  ? PT LONG TERM GOAL #1  ? Title Patient will be independent with her HEP.   ? Time 6   ? Period Weeks   ? Status New   ? Target Date 05/11/22   ?  ? PT LONG TERM GOAL #2  ? Title Patient will be able clean her house without her familiar pain exceeding 5/10.   ? Time 6   ? Period Weeks   ? Status New   ? Target Date 05/11/22   ?  ? PT LONG TERM GOAL #3  ? Title Patient will be able to lift at least 20 pounds for improved function returning to her critical job demands.   ? Time 6   ? Period Weeks   ? Status New   ? Target Date 05/11/22   ? ?  ?  ? ?  ? ? ? ? ? ? ? ?  Plan - 04/03/22 1444   ? ? Clinical Impression Statement Patient was introduced to multiple new interventions for reduced lumbar pain. These new interventions were able to moderately reduce her familiar pain. However, electrical stimulation to her left low back along with ice to the area were most effective at eliminating her familiar pain. She reported feeling good upon the conclusion of treatment. She continues to require skilled physical therapy to address her remaining impairments to maximize her functional mobility.   ? Personal Factors and Comorbidities Time since onset of injury/illness/exacerbation   ? Examination-Activity Limitations Sit;Stand   ? Examination-Participation Restrictions Occupation;Community Activity   ? Stability/Clinical Decision Making Evolving/Moderate complexity   ? Rehab Potential Fair   ? PT Frequency 2x / week   ? PT Duration 6 weeks   ? PT Treatment/Interventions Electrical Stimulation;Cryotherapy;Moist Heat;Neuromuscular re-education;Therapeutic exercise;Therapeutic activities;Functional mobility training;Patient/family education;Manual techniques;Passive range of motion;Taping   ? PT Next Visit Plan nustep, core strengthening progression   ? PT Home Exercise Plan Access Code: [email or web address]  URL: [email or web address]  Date: 03/30/2022  Prepared by: Candi Leash    Exercises  - Lower Trunk Rotations  - 2 x daily - 7 x weekly - 2 sets - 10 reps  - Hooklying Gluteal Sets  - 2 x daily - 7 x weekly - 2 sets - 10 reps - 5 seconds  hold  - Supine Active Straight Leg Raise  - 2 x daily - 7 x weekly - 2 sets - 10 reps   ? Consulted and Agree with Plan of Care Patient   ? ?  ?  ? ?  ? ? ?Patient will benefit from skilled therapeutic intervention in order to improve the following deficits and impairments:  Decreased range of motion, Decreased activity tolerance, Pain, Hypomobility, Decreased strength, Decreased mobility ? ?Visit Diagnosis: ?Other low back pain ? ? ? ? ?Problem  List ?Patient Active Problem List  ? Diagnosis Date Noted  ? S/P hysterectomy 07/19/2016  ? Post-operative state 02/18/2013  ? ? ?Granville Lewis, PT ?04/03/2022, 3:44 PM ? ?  [place] [facility] ?[address] ?[place], [place], [address] ?Phone: [phone]   Fax:  [phone] ? ?Name: Mary Pope ?MRN: [id number] ?Date of Birth: 1971/08/05 ? ? ? ?

## 2022-04-06 ENCOUNTER — Ambulatory Visit: Payer: BC Managed Care – PPO

## 2022-04-06 DIAGNOSIS — M5459 Other low back pain: Secondary | ICD-10-CM

## 2022-04-06 NOTE — Therapy (Addendum)
[facility] [place], [place], [address] Phone: [phone]   Fax:  [phone]  Physical Therapy Treatment  Patient Details  Name: Mary IZZO MRN: [id number] Date of Birth: [birth year] Referring Provider (PT): Mahar, [place]   Encounter Date: 04/06/2022   PT End of Session - 04/06/22 1435     Visit Number 3    Number of Visits 12    Date for PT Re-Evaluation 05/12/22    PT Start Time 1430    PT Stop Time 1524    PT Time Calculation (min) 54 min    Activity Tolerance Patient tolerated treatment well    Behavior During Therapy [facility] for tasks assessed/performed             Past Medical History:  Diagnosis Date   Anemia    Depression    Gallstones    Resolved - had surgery to remove gall bladder   Headache(784.0)    MIGRAINES   History of blood transfusion    [facility] at age 51 yrs old   Hypertension    Scoliosis    PT HAS 2 RODS IN HER BACK   Seizures ([place])    AS A CHILD AND THEN ONE OTHER SEIZURE AFTER 2004 DELIVERY OF CHILD --NO PROBLEMS SINCE.   SVD (spontaneous vaginal delivery)    x 3    Past Surgical History:  Procedure Laterality Date   BREAST SURGERY Bilateral    multiple Bx's - all benign   CHOLECYSTECTOMY N/A 01/31/2013   Procedure: LAPAROSCOPIC CHOLECYSTECTOMY WITH INTRAOPERATIVE CHOLANGIOGRAM;  Surgeon: Joyice Faster. Cornett, MD;  Location: [facility];  Service: General;  Laterality: N/A;   LAPAROSCOPIC VAGINAL HYSTERECTOMY WITH SALPINGECTOMY Bilateral 07/19/2016   Procedure: LAPAROSCOPIC ASSISTED VAGINAL HYSTERECTOMY WITH SALPINGECTOMY;  Surgeon: Linda Hedges, DO;  Location: [facility];  Service: Gynecology;  Laterality: Bilateral;   SPINE SURGERY     herrington rods   WISDOM TOOTH EXTRACTION      There were no vitals filed for this visit.   Subjective Assessment - 04/06/22 1432     Subjective Patient reported that her back felt better until she got home after her last appointment, but it  began hurting shortly after. She noted that her back was at a 5/10 prior to therapy today.    Pertinent History previous lumbar surgery    Limitations Sitting    How long can you sit comfortably? "not long"    Patient Stated Goals reduced back pain    Currently in Pain? Yes    Pain Score 5     Pain Location Back    Pain Onset More than a month ago                               [facility] Adult PT Treatment/Exercise - 04/06/22 0001       Exercises   Exercises Knee/Hip      Lumbar Exercises: Stretches   Passive Hamstring Stretch Right;Left;4 reps;30 seconds    Double Knee to Chest Stretch Other (comment)   LE on red ball; 2 minutes   Hip Flexor Stretch Left;4 reps;30 seconds      Lumbar Exercises: Aerobic   Nustep L3 x 13 minutes      Lumbar Exercises: Standing   Other Standing Lumbar Exercises Ball press   5 second hold; 2 minutes     Lumbar Exercises: Supine   Straight Leg Raise 20 reps  Knee/Hip Exercises: Standing   Hip Extension Both;Knee straight;15 reps;Right;Left   limited by anterior hip pulling in LLE     Modalities   Modalities Electrical Stimulation;Cryotherapy      Cryotherapy   Number Minutes Cryotherapy 15 Minutes    Cryotherapy Location Lumbar Spine    Type of Cryotherapy Ice pack      Electrical Stimulation   Electrical Stimulation Location left low back   no redness or adverse reaction with today's modalities   Electrical Stimulation Action IFC 80-150 Hz w/ 40% scan    Electrical Stimulation Parameters 15 minutes    Electrical Stimulation Goals Pain                          PT Long Term Goals - 03/30/22 1347       PT LONG TERM GOAL #1   Title Patient will be independent with her HEP.    Time 6    Period Weeks    Status New    Target Date 05/11/22      PT LONG TERM GOAL #2   Title Patient will be able clean her house without her familiar pain exceeding 5/10.    Time 6    Period Weeks    Status New     Target Date 05/11/22      PT LONG TERM GOAL #3   Title Patient will be able to lift at least 20 pounds for improved function returning to her critical job demands.    Time 6    Period Weeks    Status New    Target Date 05/11/22                   Plan - 04/06/22 1435     Clinical Impression Statement Patient was introduced to multiple new interventions for improved lumbopelvic strength and stability with moderate difficulty. She required minimal cueing with today's new interventions for proper exercise performance. She experienced a moderate increase in pulling along her anterior left hip with standing hip extension, but this was able to be moderately reduced with a supine hip flexor stretch. She reported that her back felt better upon the conclusion of treatment. She continues to require skilled physical therapy to address her remaining impairments to return to her prior level of function.    Personal Factors and Comorbidities Time since onset of injury/illness/exacerbation    Examination-Activity Limitations Sit;Stand    Examination-Participation Restrictions Occupation;Community Activity    Stability/Clinical Decision Making Evolving/Moderate complexity    Rehab Potential Fair    PT Frequency 2x / week    PT Duration 6 weeks    PT Treatment/Interventions Electrical Stimulation;Cryotherapy;Moist Heat;Neuromuscular re-education;Therapeutic exercise;Therapeutic activities;Functional mobility training;Patient/family education;Manual techniques;Passive range of motion;Taping    PT Next Visit Plan nustep, core strengthening progression    PT Home Exercise Plan Access Code: [email or web address]  URL: [email or web address]  Date: 03/30/2022  Prepared by: Jacqulynn Cadet    Exercises  - Lower Trunk Rotations  - 2 x daily - 7 x weekly - 2 sets - 10 reps  - Hooklying Gluteal Sets  - 2 x daily - 7 x weekly - 2 sets - 10 reps - 5 seconds  hold  - Supine Active Straight Leg Raise  - 2 x daily - 7  x weekly - 2 sets - 10 reps    Consulted and Agree with Plan of Care Patient  Patient will benefit from skilled therapeutic intervention in order to improve the following deficits and impairments:  Decreased range of motion, Decreased activity tolerance, Pain, Hypomobility, Decreased strength, Decreased mobility  Visit Diagnosis: Other low back pain     Problem List Patient Active Problem List   Diagnosis Date Noted   S/P hysterectomy 07/19/2016   Post-operative state 02/18/2013    Darlin Coco, PT 04/06/2022, 3:29 PM  [facility] [place], [place], [address] Phone: [phone]   Fax:  [phone]  Name: LAFREDA CASEBEER MRN: [id number] Date of Birth: 1971-05-07  PHYSICAL THERAPY DISCHARGE SUMMARY  Visits from Start of Care: 3  Current functional level related to goals / functional outcomes: Patient is being discharged at this time as she has not returned since her last appointment.    Remaining deficits: Pain   Education / Equipment: HEP   Patient agrees to discharge. Patient goals were not met. Patient is being discharged due to not returning since the last visit.  Jacqulynn Cadet, PT, DPT
